# Patient Record
Sex: Male | Born: 1940 | Race: White | Hispanic: No | Smoking: Never smoker
Health system: Southern US, Community
[De-identification: ages and names within clinical notes are randomized; demographics above are authoritative.]

## PROBLEM LIST (undated history)

## (undated) DIAGNOSIS — F32A Depression, unspecified: Secondary | ICD-10-CM

## (undated) DIAGNOSIS — I10 Essential (primary) hypertension: Secondary | ICD-10-CM

## (undated) DIAGNOSIS — I739 Peripheral vascular disease, unspecified: Secondary | ICD-10-CM

## (undated) DIAGNOSIS — M109 Gout, unspecified: Secondary | ICD-10-CM

## (undated) DIAGNOSIS — R627 Adult failure to thrive: Secondary | ICD-10-CM

## (undated) DIAGNOSIS — F329 Major depressive disorder, single episode, unspecified: Secondary | ICD-10-CM

## (undated) DIAGNOSIS — N189 Chronic kidney disease, unspecified: Secondary | ICD-10-CM

---
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
## 2004-03-17 ENCOUNTER — Ambulatory Visit: Payer: Self-pay | Admitting: General Surgery

## 2004-09-28 ENCOUNTER — Ambulatory Visit: Payer: Self-pay | Admitting: Gastroenterology

## 2004-10-23 ENCOUNTER — Ambulatory Visit: Payer: Self-pay | Admitting: Unknown Physician Specialty

## 2004-11-02 ENCOUNTER — Ambulatory Visit: Payer: Self-pay | Admitting: Unknown Physician Specialty

## 2005-03-20 ENCOUNTER — Ambulatory Visit: Payer: Self-pay | Admitting: Urology

## 2005-06-05 ENCOUNTER — Ambulatory Visit: Payer: Self-pay | Admitting: *Deleted

## 2005-12-17 ENCOUNTER — Ambulatory Visit: Payer: Self-pay | Admitting: Internal Medicine

## 2007-07-22 ENCOUNTER — Ambulatory Visit: Payer: Self-pay | Admitting: Internal Medicine

## 2008-04-13 ENCOUNTER — Ambulatory Visit: Payer: Self-pay | Admitting: Unknown Physician Specialty

## 2011-12-24 LAB — CBC
HGB: 9.3 g/dL — ABNORMAL LOW (ref 13.0–18.0)
MCH: 28.7 pg (ref 26.0–34.0)
MCHC: 31.9 g/dL — ABNORMAL LOW (ref 32.0–36.0)
Platelet: 674 10*3/uL — ABNORMAL HIGH (ref 150–440)

## 2011-12-24 LAB — CK TOTAL AND CKMB (NOT AT ARMC)
CK, Total: 150 U/L (ref 35–232)
CK-MB: 0.8 ng/mL (ref 0.5–3.6)

## 2011-12-24 LAB — COMPREHENSIVE METABOLIC PANEL
Bilirubin,Total: 2.8 mg/dL — ABNORMAL HIGH (ref 0.2–1.0)
Chloride: 108 mmol/L — ABNORMAL HIGH (ref 98–107)
Co2: 11 mmol/L — ABNORMAL LOW (ref 21–32)
Creatinine: 2.27 mg/dL — ABNORMAL HIGH (ref 0.60–1.30)
EGFR (African American): 32 — ABNORMAL LOW
EGFR (Non-African Amer.): 28 — ABNORMAL LOW
Osmolality: 296 (ref 275–301)
Potassium: 5 mmol/L (ref 3.5–5.1)
SGPT (ALT): 10 U/L — ABNORMAL LOW (ref 12–78)
Sodium: 136 mmol/L (ref 136–145)
Total Protein: 7.9 g/dL (ref 6.4–8.2)

## 2011-12-25 ENCOUNTER — Inpatient Hospital Stay: Payer: Self-pay | Admitting: Family Medicine

## 2011-12-25 LAB — URINALYSIS, COMPLETE
Ketone: NEGATIVE
Nitrite: NEGATIVE
Ph: 5 (ref 4.5–8.0)
Protein: NEGATIVE
RBC,UR: 10 /HPF (ref 0–5)
Specific Gravity: 1.019 (ref 1.003–1.030)
WBC UR: 12 /HPF (ref 0–5)

## 2011-12-25 LAB — BASIC METABOLIC PANEL
Anion Gap: 10 (ref 7–16)
BUN: 68 mg/dL — ABNORMAL HIGH (ref 7–18)
Calcium, Total: 8.4 mg/dL — ABNORMAL LOW (ref 8.5–10.1)
Chloride: 116 mmol/L — ABNORMAL HIGH (ref 98–107)
Creatinine: 1.9 mg/dL — ABNORMAL HIGH (ref 0.60–1.30)
EGFR (African American): 40 — ABNORMAL LOW
Potassium: 4.4 mmol/L (ref 3.5–5.1)
Sodium: 141 mmol/L (ref 136–145)

## 2011-12-25 LAB — HEPATIC FUNCTION PANEL A (ARMC)
Albumin: 2.5 g/dL — ABNORMAL LOW (ref 3.4–5.0)
Alkaline Phosphatase: 108 U/L (ref 50–136)
Bilirubin, Direct: 1.6 mg/dL — ABNORMAL HIGH (ref 0.00–0.20)
SGOT(AST): 17 U/L (ref 15–37)
SGPT (ALT): 8 U/L — ABNORMAL LOW (ref 12–78)
Total Protein: 6.4 g/dL (ref 6.4–8.2)

## 2011-12-25 LAB — MAGNESIUM: Magnesium: 2.6 mg/dL — ABNORMAL HIGH

## 2011-12-25 LAB — PHOSPHORUS: Phosphorus: 4 mg/dL (ref 2.5–4.9)

## 2011-12-26 LAB — COMPREHENSIVE METABOLIC PANEL
Albumin: 2.4 g/dL — ABNORMAL LOW (ref 3.4–5.0)
Anion Gap: 7 (ref 7–16)
Bilirubin,Total: 1.4 mg/dL — ABNORMAL HIGH (ref 0.2–1.0)
Calcium, Total: 8 mg/dL — ABNORMAL LOW (ref 8.5–10.1)
Chloride: 121 mmol/L — ABNORMAL HIGH (ref 98–107)
Co2: 17 mmol/L — ABNORMAL LOW (ref 21–32)
Creatinine: 1.58 mg/dL — ABNORMAL HIGH (ref 0.60–1.30)
EGFR (African American): 50 — ABNORMAL LOW
EGFR (Non-African Amer.): 43 — ABNORMAL LOW
Osmolality: 301 (ref 275–301)
Potassium: 4.1 mmol/L (ref 3.5–5.1)
Sodium: 145 mmol/L (ref 136–145)

## 2011-12-26 LAB — CBC WITH DIFFERENTIAL/PLATELET
Basophil #: 0 10*3/uL (ref 0.0–0.1)
Basophil %: 0.2 %
Eosinophil #: 0.1 10*3/uL (ref 0.0–0.7)
Eosinophil %: 1.1 %
HCT: 22 % — ABNORMAL LOW (ref 40.0–52.0)
HGB: 7.1 g/dL — ABNORMAL LOW (ref 13.0–18.0)
Lymphocyte #: 0.6 10*3/uL — ABNORMAL LOW (ref 1.0–3.6)
Lymphocyte %: 10 %
MCH: 28.7 pg (ref 26.0–34.0)
MCHC: 32.2 g/dL (ref 32.0–36.0)
Monocyte %: 7.8 %
Neutrophil #: 4.5 10*3/uL (ref 1.4–6.5)
RBC: 2.47 10*6/uL — ABNORMAL LOW (ref 4.40–5.90)

## 2011-12-26 LAB — MAGNESIUM: Magnesium: 2.5 mg/dL — ABNORMAL HIGH

## 2011-12-27 LAB — CBC WITH DIFFERENTIAL/PLATELET
Basophil %: 0.5 %
Eosinophil #: 0.1 10*3/uL (ref 0.0–0.7)
HCT: 19.8 % — ABNORMAL LOW (ref 40.0–52.0)
HGB: 6.5 g/dL — ABNORMAL LOW (ref 13.0–18.0)
Lymphocyte #: 0.4 10*3/uL — ABNORMAL LOW (ref 1.0–3.6)
Lymphocyte %: 7.2 %
MCH: 29.4 pg (ref 26.0–34.0)
MCHC: 32.8 g/dL (ref 32.0–36.0)
MCV: 90 fL (ref 80–100)
Monocyte #: 0.3 x10 3/mm (ref 0.2–1.0)
Neutrophil #: 4.2 10*3/uL (ref 1.4–6.5)
RBC: 2.2 10*6/uL — ABNORMAL LOW (ref 4.40–5.90)
RDW: 15.8 % — ABNORMAL HIGH (ref 11.5–14.5)

## 2011-12-27 LAB — BASIC METABOLIC PANEL
BUN: 33 mg/dL — ABNORMAL HIGH (ref 7–18)
Calcium, Total: 7.9 mg/dL — ABNORMAL LOW (ref 8.5–10.1)
Chloride: 123 mmol/L — ABNORMAL HIGH (ref 98–107)
Co2: 17 mmol/L — ABNORMAL LOW (ref 21–32)
Osmolality: 299 (ref 275–301)
Potassium: 4.2 mmol/L (ref 3.5–5.1)
Sodium: 147 mmol/L — ABNORMAL HIGH (ref 136–145)

## 2011-12-28 LAB — BASIC METABOLIC PANEL
Anion Gap: 9 (ref 7–16)
Calcium, Total: 8 mg/dL — ABNORMAL LOW (ref 8.5–10.1)
Chloride: 119 mmol/L — ABNORMAL HIGH (ref 98–107)
Co2: 16 mmol/L — ABNORMAL LOW (ref 21–32)
Creatinine: 1.6 mg/dL — ABNORMAL HIGH (ref 0.60–1.30)
Osmolality: 292 (ref 275–301)
Potassium: 4.3 mmol/L (ref 3.5–5.1)

## 2011-12-28 LAB — MAGNESIUM: Magnesium: 2.3 mg/dL

## 2011-12-28 LAB — HEMOGLOBIN: HGB: 7.9 g/dL — ABNORMAL LOW (ref 13.0–18.0)

## 2011-12-29 LAB — BASIC METABOLIC PANEL WITH GFR
Anion Gap: 9
BUN: 18 mg/dL
Calcium, Total: 7.6 mg/dL — ABNORMAL LOW
Chloride: 116 mmol/L — ABNORMAL HIGH
Co2: 17 mmol/L — ABNORMAL LOW
Creatinine: 1.32 mg/dL — ABNORMAL HIGH
EGFR (African American): >60
EGFR (Non-African Amer.): 53 — ABNORMAL LOW
Glucose: 98 mg/dL
Osmolality: 285
Potassium: 3.8 mmol/L
Sodium: 142 mmol/L

## 2011-12-30 LAB — CBC WITH DIFFERENTIAL/PLATELET
Basophil #: 0 10*3/uL (ref 0.0–0.1)
Lymphocyte %: 21.3 %
Monocyte #: 0.2 x10 3/mm (ref 0.2–1.0)
Monocyte %: 5.6 %
Neutrophil %: 68 %
Platelet: 268 10*3/uL (ref 150–440)
RDW: 15.4 % — ABNORMAL HIGH (ref 11.5–14.5)
WBC: 3.9 10*3/uL (ref 3.8–10.6)

## 2011-12-30 LAB — BASIC METABOLIC PANEL
Calcium, Total: 7.4 mg/dL — ABNORMAL LOW (ref 8.5–10.1)
Chloride: 112 mmol/L — ABNORMAL HIGH (ref 98–107)
Co2: 18 mmol/L — ABNORMAL LOW (ref 21–32)
Creatinine: 1.12 mg/dL (ref 0.60–1.30)
EGFR (African American): >60
EGFR (Non-African Amer.): >60
Osmolality: 276 (ref 275–301)
Sodium: 139 mmol/L (ref 136–145)

## 2014-05-11 NOTE — Consult Note (Signed)
PATIENT NAME:  Troy Booker, Troy Booker MR#:  [id number] DATE OF BIRTH:  [birth year]  DATE OF CONSULTATION:  12/26/2011  REFERRING PHYSICIAN:   CONSULTING PHYSICIAN:  Shakiara Lukic K. Tiwan Schnitker, MD  SUBJECTIVE: The patient is a 74 year old white male who was seen in his room. He is resting calmly and quiet, but he did not talk much and eye contact is not very good, although he turned his head. I discussed with the floor physician about changing to Remeron to help him rest better at night and also Zyprexa 2.5 mg at bedtime to help him rest and also help him with his appetite and his thoughts.   OBJECTIVE: Dressed in hospital scrubs, alert but appears rather scared, would not verbalize anything today, just nods his head and turns his head today. Still refusing to eat any food and is on IV fluids. He has to get Ensure and not eating at all and not able to rest. Further mental status not done.  IMPRESSION: Major depressive disorder.  RECOMMENDATIONS: Discontinue Lexapro and discontinue Elavil. Start the patient on Remeron 15 mg p.o. at bedtime along with Zyprexa 2.5 mg at bedtime. This will probably help him rest better and help him with his appetite.  ____________________________ Wallace Cullens. Franchot Mimes, MD skc:slb D: 12/26/2011 10:09:10 ET T: 12/26/2011 10:45:18 ET JOB#: [id number]  cc: Arlyn Leak K. Franchot Mimes, MD, <Dictator> Dewain Penning MD ELECTRONICALLY SIGNED 12/29/2011 14:46

## 2014-05-11 NOTE — Consult Note (Signed)
PATIENT NAME:  Troy Booker, Troy Booker MR#:  [id number] DATE OF BIRTH:  03-05-1940  DATE OF CONSULTATION:  12/25/2011  REFERRING PHYSICIAN:   CONSULTING PHYSICIAN:  Qunicy Higinbotham K. Damontre Millea, MD  REASON FOR CONSULTATION: The patient was asked to be seen in consultation for failure to thrive and depression with neglect.  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 The patient is widowed for a long time and has been living with his son.  According to information obtained from the staff, the patient was covered with feces when he was brought to the hospital. He had feces on his beard.  In addition, he has excoriated wound and shows "neglect". Social Services was involved and they came to see the patient.  Staff reports the patient refuses to eat and all he wants to drink is Coca-Cola. He has to be encouraged a lot to eat.    PAST PSYCHIATRIC HISTORY: The patient denies any history of inpatient hospitalization to psychiatry, denies any history of mental illness, denies any history of suicide attempts, never been followed by a psychiatrist.  ALCOHOL AND DRUGS: Denied.  MENTAL STATUS: The patient is alert and oriented to place, person, and time, aware of the situation that brought him to the hospital. He is a good historian and he stated that his wife died many years ago, cannot remember the details. He reports that he lives with his son who is good to him and the reason why he does not eat is because he does not feel hungry and does not want to eat and said "I am just not hungry." Denies feeling hopeless or helpless, denies feeling worthless or useless, denies any ideas or plans to hurt himself or others. Denies any psychotic symptoms. Denies auditory or visual hallucinations or feeling paranoid. Insight and judgment are guarded.   IMPRESSION: Major depressive disorder with appetite loss.  RECOMMENDATIONS: Encourage the patient to eat and probably Ensure. Increase  his Lexapro to 20 mg p.o. daily, which will probably help him better with his appetite and eating.     Consider adding Elavil 10 mg p.o. at bedtime which will probably help him with his eating. Social Services has been involved for appropriate placement, at the time of discharge. I will reevaluate the patient on 12/26/2011. ____________________________ Wallace Cullens. Franchot Mimes, MD skc:slb D: 12/25/2011 18:43:50 ET T: 12/26/2011 08:17:01 ET JOB#: [id number]  cc: Arlyn Leak K. Franchot Mimes, MD, <Dictator> Dewain Penning MD ELECTRONICALLY SIGNED 12/29/2011 14:45
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
## 2016-05-11 DIAGNOSIS — F325 Major depressive disorder, single episode, in full remission: Secondary | ICD-10-CM | POA: Diagnosis present

## 2016-05-11 DIAGNOSIS — R350 Frequency of micturition: Secondary | ICD-10-CM | POA: Diagnosis present

## 2016-05-11 DIAGNOSIS — F03918 Unspecified dementia, unspecified severity, with other behavioral disturbance: Secondary | ICD-10-CM

## 2016-05-11 DIAGNOSIS — N401 Enlarged prostate with lower urinary tract symptoms: Secondary | ICD-10-CM | POA: Diagnosis present

## 2019-06-03 ENCOUNTER — Other Ambulatory Visit: Payer: Self-pay

## 2019-06-03 ENCOUNTER — Emergency Department: Payer: Medicare Other

## 2019-06-03 ENCOUNTER — Encounter: Payer: Self-pay | Admitting: Emergency Medicine

## 2019-06-03 ENCOUNTER — Emergency Department
Admission: EM | Admit: 2019-06-03 | Discharge: 2019-06-03 | Disposition: A | Discharge status: Home / Self Care | Payer: Medicare Other | Attending: Student | Admitting: Student

## 2019-06-03 DIAGNOSIS — W19XXXA Unspecified fall, initial encounter: Secondary | ICD-10-CM

## 2019-06-03 DIAGNOSIS — Y9389 Activity, other specified: Secondary | ICD-10-CM | POA: Diagnosis not present

## 2019-06-03 DIAGNOSIS — Y999 Unspecified external cause status: Secondary | ICD-10-CM | POA: Diagnosis not present

## 2019-06-03 DIAGNOSIS — N189 Chronic kidney disease, unspecified: Secondary | ICD-10-CM | POA: Diagnosis not present

## 2019-06-03 DIAGNOSIS — I129 Hypertensive chronic kidney disease with stage 1 through stage 4 chronic kidney disease, or unspecified chronic kidney disease: Secondary | ICD-10-CM | POA: Insufficient documentation

## 2019-06-03 DIAGNOSIS — W06XXXA Fall from bed, initial encounter: Secondary | ICD-10-CM | POA: Diagnosis not present

## 2019-06-03 DIAGNOSIS — Y92122 Bedroom in nursing home as the place of occurrence of the external cause: Secondary | ICD-10-CM | POA: Insufficient documentation

## 2019-06-03 DIAGNOSIS — S0101XA Laceration without foreign body of scalp, initial encounter: Secondary | ICD-10-CM | POA: Insufficient documentation

## 2019-06-03 DIAGNOSIS — S0990XA Unspecified injury of head, initial encounter: Secondary | ICD-10-CM | POA: Diagnosis present

## 2019-06-03 HISTORY — DX: Gout, unspecified: M10.9

## 2019-06-03 HISTORY — DX: Chronic kidney disease, unspecified: N18.9

## 2019-06-03 HISTORY — DX: Adult failure to thrive: R62.7

## 2019-06-03 HISTORY — DX: Essential (primary) hypertension: I10

## 2019-06-03 HISTORY — DX: Peripheral vascular disease, unspecified: I73.9

## 2019-06-03 HISTORY — DX: Depression, unspecified: F32.A

## 2019-06-03 NOTE — Discharge Instructions (Addendum)
Thank you for letting us take care of you in the emergency department today.   You have 2 staples that need to be removed in 1 week. This can be done in your regular doctor's office, an urgent care, or you can return here for removal.   Please follow up with: Your primary care doctor to review your ER visit and follow up on your symptoms.   Please return to the ER for any new or worsening symptoms.

## 2019-06-03 NOTE — ED Notes (Signed)
[facility]  CALLED  FOR  TRANSFER  TO  [facility]

## 2019-06-03 NOTE — ED Provider Notes (Signed)
[facility] Emergency Department Provider Note  ____________________________________________   First MD Initiated Contact with Patient 06/03/19 [phone]     (approximate)  I have reviewed the triage vital signs and the nursing notes.  History  Chief Complaint Head Injury    HPI Troy Booker is a 79 y.o. male with a history of failure to thrive in adult, depression, CKD, PVD who presents to the emergency department for a fall with resultant head injury.  Patient states he was asleep when he accidentally rolled out of bed and must of hit his head.  He has an abrasion/skin tear to the right scalp that is hemostatic.  Reports mild aching pain to this area with examination, but otherwise pain is 0/10.  Denies any other injuries elsewhere, including denies any neck pain, chest pain, abdominal pain, hip pain, extremity pain, back pain.  He is not on any blood thinning medications.  He denies any headache or dizziness.   Past Medical Hx Past Medical History:  Diagnosis Date  . Chronic kidney disease   . Depression   . FTT (failure to thrive) in adult   . Gout   . Hypertension   . PVD (peripheral vascular disease) ([place])     Problem List There are no problems to display for this patient.   Past Surgical Hx Non-contributory  Medications Allopurinol Amlodipine Duloxetine Gabapentin Mirtazapine Tamsulosin   Allergies Patient has no known allergies.  Family Hx No family history on file.  Social Hx Lives at [facility] [place]  Review of Systems  Constitutional: Negative for fever. Negative for chills. Eyes: Negative for visual changes. ENT: Negative for sore throat. Cardiovascular: Negative for chest pain. Respiratory: Negative for shortness of breath. Gastrointestinal: Negative for nausea. Negative for vomiting.  Genitourinary: Negative for dysuria. Musculoskeletal: Negative for leg swelling. Skin: + scalp abrasion/skin tear Neurological:  Negative for headaches.   Physical Exam  Vital Signs: ED Triage Vitals  Enc Vitals Group     BP 06/03/19 0612 138/70     Pulse Rate 06/03/19 0612 (!) 110     Resp 06/03/19 0612 20     Temp 06/03/19 0612 97.8 F (36.6 C)     Temp Source 06/03/19 0612 Oral     SpO2 06/03/19 0612 96 %     Weight 06/03/19 0615 204 lb (92.5 kg)     Height --      Head Circumference --      Peak Flow --      Pain Score 06/03/19 0616 0     Pain Loc --      Pain Edu? --      Excl. in [place]? --     Constitutional: Alert and oriented. Well appearing. NAD.  Head: Normocephalic.  Abrasion to the right scalp, temporal area, with a small (~1.5 cm) associated triangular skin flap. Not a discrete laceration, but would benefit from being tacked down. Patient agreeable with staples.  Eyes: Conjunctivae clear. Sclera anicteric. Pupils equal and symmetric. Nose: No masses or lesions. No congestion or rhinorrhea. Mouth/Throat: Wearing mask.  Neck: No stridor. Trachea midline.  No midline CS tenderness. Cardiovascular: Normal rate, regular rhythm. Extremities well perfused. Chest: Stable, nontender, no crepitance or deformities. Respiratory: Normal respiratory effort.  Lungs CTAB. Gastrointestinal: Soft. Non-distended. Non-tender.  Pelvis: Stable to AP and lateral compression.  FROM bilateral hips. Genitourinary: Deferred. Musculoskeletal: No lower extremity edema. No deformities.  FROM and nontender through bilateral shoulders, elbows, wrists, hips, knees, ankles. Neurologic:  Normal speech and language. No gross focal or lateralizing neurologic deficits are appreciated.  Skin: Abrasion/skin tear as above. Psychiatric: Mood and affect are appropriate for situation.   Radiology  Personally reviewed available imaging myself.   CT head/CS - IMPRESSION:  1. No evidence of acute intracranial or cervical spine injury.  2. Right scalp wound without calvarial fracture.    Procedures  Procedure(s) performed  (including critical care):  Marland KitchenMarland KitchenLaceration Repair  Date/Time: 06/03/2019 9:19 AM Performed by: Lilia Pro., MD Authorized by: Lilia Pro., MD   Consent:    Consent obtained:  Verbal   Consent given by:  Patient   Alternatives discussed:  No treatment Anesthesia (see MAR for exact dosages):    Anesthesia method:  None (offered patient local lidocaine injection, but given he will likely only need 1-2 staples, he opted to go straight for staples and defer an additional needle injection) Laceration details:    Location:  Scalp   Scalp location:  R temporal   Wound length (cm): 1-2 cm triangular skin flap. Repair type:    Repair type:  Simple Exploration:    Hemostasis achieved with:  Direct pressure   Wound exploration: wound explored through full range of motion     Wound extent: no foreign bodies/material noted   Treatment:    Area cleansed with:  Soap and water   Amount of cleaning:  Standard Skin repair:    Repair method:  Staples   Number of staples:  2 Post-procedure details:    Dressing:  Open (no dressing)   Patient tolerance of procedure:  Tolerated well, no immediate complications     Initial Impression / Assessment and Plan / MDM / ED Course  79 y.o. male who presents to the ED for a fall with head injury & scalp abrasion/skin tear, as a result of rolling out of bed.  Not on anticoagulation.  Exam as above.  Not a true discrete laceration, but this triangle skin tear/flap would benefit from being tacked down.  Patient agreeable with staples.  2 staples placed with good results, patient tolerated well.  CT imaging negative for any acute traumatic injuries.  As such, feel patient is stable for discharge with outpatient follow-up.  Given return precautions.  _______________________________   As part of my medical decision making I have reviewed available labs, radiology tests, reviewed old records/performed chart review.     Final Clinical Impression(s) / ED  Diagnosis  Final diagnoses:  Fall, initial encounter  Laceration of scalp, initial encounter       Note:  This document was prepared using Dragon voice recognition software and may include unintentional dictation errors.   Lilia Pro., MD 06/03/19 [phone]

## 2019-06-03 NOTE — ED Triage Notes (Signed)
Pt to triage via w/c with no distress noted, mask in place; EMS brought pt in from [facility]; pt st rolled OOB hitting rt side of head on dresser; dried blood noted; denies LOC/HA/dizziness; no cervical tenderness with palpation

## 2020-01-04 ENCOUNTER — Encounter: Payer: Self-pay | Admitting: Internal Medicine

## 2020-01-04 ENCOUNTER — Emergency Department: Payer: Medicare Other

## 2020-01-04 ENCOUNTER — Inpatient Hospital Stay
Admission: EM | Admit: 2020-01-04 | Discharge: 2020-01-08 | DRG: 871 | Disposition: A | Discharge status: Hospice | Payer: Medicare Other | Source: Skilled Nursing Facility | Attending: Internal Medicine | Admitting: Internal Medicine

## 2020-01-04 ENCOUNTER — Other Ambulatory Visit: Payer: Self-pay

## 2020-01-04 DIAGNOSIS — Z20822 Contact with and (suspected) exposure to covid-19: Secondary | ICD-10-CM | POA: Diagnosis present

## 2020-01-04 DIAGNOSIS — N179 Acute kidney failure, unspecified: Secondary | ICD-10-CM | POA: Diagnosis present

## 2020-01-04 DIAGNOSIS — F325 Major depressive disorder, single episode, in full remission: Secondary | ICD-10-CM | POA: Diagnosis present

## 2020-01-04 DIAGNOSIS — R350 Frequency of micturition: Secondary | ICD-10-CM | POA: Diagnosis present

## 2020-01-04 DIAGNOSIS — F32A Depression, unspecified: Secondary | ICD-10-CM | POA: Diagnosis present

## 2020-01-04 DIAGNOSIS — C799 Secondary malignant neoplasm of unspecified site: Secondary | ICD-10-CM | POA: Diagnosis not present

## 2020-01-04 DIAGNOSIS — E860 Dehydration: Secondary | ICD-10-CM | POA: Diagnosis present

## 2020-01-04 DIAGNOSIS — N1831 Chronic kidney disease, stage 3a: Secondary | ICD-10-CM | POA: Diagnosis present

## 2020-01-04 DIAGNOSIS — J9601 Acute respiratory failure with hypoxia: Secondary | ICD-10-CM | POA: Diagnosis present

## 2020-01-04 DIAGNOSIS — R54 Age-related physical debility: Secondary | ICD-10-CM | POA: Diagnosis present

## 2020-01-04 DIAGNOSIS — J189 Pneumonia, unspecified organism: Secondary | ICD-10-CM

## 2020-01-04 DIAGNOSIS — F0390 Unspecified dementia without behavioral disturbance: Secondary | ICD-10-CM | POA: Diagnosis not present

## 2020-01-04 DIAGNOSIS — E039 Hypothyroidism, unspecified: Secondary | ICD-10-CM | POA: Diagnosis present

## 2020-01-04 DIAGNOSIS — F0281 Dementia in other diseases classified elsewhere with behavioral disturbance: Secondary | ICD-10-CM | POA: Diagnosis present

## 2020-01-04 DIAGNOSIS — Z66 Do not resuscitate: Secondary | ICD-10-CM | POA: Diagnosis present

## 2020-01-04 DIAGNOSIS — A419 Sepsis, unspecified organism: Principal | ICD-10-CM | POA: Diagnosis present

## 2020-01-04 DIAGNOSIS — I10 Essential (primary) hypertension: Secondary | ICD-10-CM | POA: Diagnosis present

## 2020-01-04 DIAGNOSIS — R918 Other nonspecific abnormal finding of lung field: Secondary | ICD-10-CM | POA: Diagnosis not present

## 2020-01-04 DIAGNOSIS — J181 Lobar pneumonia, unspecified organism: Secondary | ICD-10-CM

## 2020-01-04 DIAGNOSIS — N401 Enlarged prostate with lower urinary tract symptoms: Secondary | ICD-10-CM | POA: Diagnosis present

## 2020-01-04 DIAGNOSIS — G309 Alzheimer's disease, unspecified: Secondary | ICD-10-CM | POA: Diagnosis present

## 2020-01-04 DIAGNOSIS — F03918 Unspecified dementia, unspecified severity, with other behavioral disturbance: Secondary | ICD-10-CM

## 2020-01-04 DIAGNOSIS — I129 Hypertensive chronic kidney disease with stage 1 through stage 4 chronic kidney disease, or unspecified chronic kidney disease: Secondary | ICD-10-CM | POA: Diagnosis present

## 2020-01-04 DIAGNOSIS — W06XXXA Fall from bed, initial encounter: Secondary | ICD-10-CM | POA: Diagnosis present

## 2020-01-04 DIAGNOSIS — G629 Polyneuropathy, unspecified: Secondary | ICD-10-CM | POA: Diagnosis present

## 2020-01-04 DIAGNOSIS — Z6829 Body mass index (BMI) 29.0-29.9, adult: Secondary | ICD-10-CM

## 2020-01-04 DIAGNOSIS — C787 Secondary malignant neoplasm of liver and intrahepatic bile duct: Secondary | ICD-10-CM | POA: Diagnosis present

## 2020-01-04 DIAGNOSIS — E440 Moderate protein-calorie malnutrition: Secondary | ICD-10-CM | POA: Diagnosis present

## 2020-01-04 DIAGNOSIS — Z87891 Personal history of nicotine dependence: Secondary | ICD-10-CM

## 2020-01-04 DIAGNOSIS — Z7189 Other specified counseling: Secondary | ICD-10-CM | POA: Diagnosis not present

## 2020-01-04 DIAGNOSIS — C3431 Malignant neoplasm of lower lobe, right bronchus or lung: Secondary | ICD-10-CM | POA: Diagnosis present

## 2020-01-04 DIAGNOSIS — N189 Chronic kidney disease, unspecified: Secondary | ICD-10-CM | POA: Diagnosis not present

## 2020-01-04 DIAGNOSIS — R0902 Hypoxemia: Secondary | ICD-10-CM

## 2020-01-04 DIAGNOSIS — F0391 Unspecified dementia with behavioral disturbance: Secondary | ICD-10-CM | POA: Diagnosis not present

## 2020-01-04 DIAGNOSIS — R627 Adult failure to thrive: Secondary | ICD-10-CM | POA: Diagnosis not present

## 2020-01-04 DIAGNOSIS — Z515 Encounter for palliative care: Secondary | ICD-10-CM

## 2020-01-04 DIAGNOSIS — J440 Chronic obstructive pulmonary disease with acute lower respiratory infection: Secondary | ICD-10-CM | POA: Diagnosis present

## 2020-01-04 DIAGNOSIS — R6520 Severe sepsis without septic shock: Secondary | ICD-10-CM | POA: Diagnosis present

## 2020-01-04 DIAGNOSIS — I739 Peripheral vascular disease, unspecified: Secondary | ICD-10-CM | POA: Diagnosis present

## 2020-01-04 DIAGNOSIS — E119 Type 2 diabetes mellitus without complications: Secondary | ICD-10-CM

## 2020-01-04 DIAGNOSIS — M109 Gout, unspecified: Secondary | ICD-10-CM | POA: Diagnosis present

## 2020-01-04 LAB — APTT: aPTT: 38 seconds — ABNORMAL HIGH (ref 24–36)

## 2020-01-04 LAB — CBC WITH DIFFERENTIAL/PLATELET
Abs Immature Granulocytes: 0.06 10*3/uL (ref 0.00–0.07)
Basophils Absolute: 0.1 10*3/uL (ref 0.0–0.1)
Basophils Relative: 1 %
Eosinophils Absolute: 0.2 10*3/uL (ref 0.0–0.5)
Eosinophils Relative: 2 %
HCT: 45.2 % (ref 39.0–52.0)
Hemoglobin: 13.3 g/dL (ref 13.0–17.0)
Immature Granulocytes: 1 %
Lymphocytes Relative: 11 %
Lymphs Abs: 1.4 10*3/uL (ref 0.7–4.0)
MCH: 24.1 pg — ABNORMAL LOW (ref 26.0–34.0)
MCHC: 29.4 g/dL — ABNORMAL LOW (ref 30.0–36.0)
MCV: 81.9 fL (ref 80.0–100.0)
Monocytes Absolute: 0.7 10*3/uL (ref 0.1–1.0)
Monocytes Relative: 6 %
Neutro Abs: 10.1 10*3/uL — ABNORMAL HIGH (ref 1.7–7.7)
Neutrophils Relative %: 79 %
Platelets: 587 10*3/uL — ABNORMAL HIGH (ref 150–400)
RBC: 5.52 MIL/uL (ref 4.22–5.81)
RDW: 17.2 % — ABNORMAL HIGH (ref 11.5–15.5)
WBC: 12.5 10*3/uL — ABNORMAL HIGH (ref 4.0–10.5)
nRBC: 0 % (ref 0.0–0.2)

## 2020-01-04 LAB — HEMOGLOBIN A1C
Hgb A1c MFr Bld: 6.3 % — ABNORMAL HIGH (ref 4.8–5.6)
Mean Plasma Glucose: 134.11 mg/dL

## 2020-01-04 LAB — RESP PANEL BY RT-PCR (FLU A&B, COVID) ARPGX2
Influenza A by PCR: NEGATIVE
Influenza B by PCR: NEGATIVE
SARS Coronavirus 2 by RT PCR: NEGATIVE

## 2020-01-04 LAB — CBG MONITORING, ED: Glucose-Capillary: 110 mg/dL — ABNORMAL HIGH (ref 70–99)

## 2020-01-04 LAB — BASIC METABOLIC PANEL
Anion gap: 15 (ref 5–15)
Anion gap: 11 (ref 5–15)
BUN: 16 mg/dL (ref 8–23)
BUN: 15 mg/dL (ref 8–23)
CO2: 29 mmol/L (ref 22–32)
CO2: 26 mmol/L (ref 22–32)
Calcium: 11 mg/dL — ABNORMAL HIGH (ref 8.9–10.3)
Calcium: 9.9 mg/dL (ref 8.9–10.3)
Chloride: 95 mmol/L — ABNORMAL LOW (ref 98–111)
Chloride: 100 mmol/L (ref 98–111)
Creatinine, Ser: 1.44 mg/dL — ABNORMAL HIGH (ref 0.61–1.24)
Creatinine, Ser: 1.15 mg/dL (ref 0.61–1.24)
GFR, Estimated: 49 mL/min — ABNORMAL LOW (ref >60)
GFR, Estimated: >60 mL/min (ref >60)
Glucose, Bld: 158 mg/dL — ABNORMAL HIGH (ref 70–99)
Glucose, Bld: 166 mg/dL — ABNORMAL HIGH (ref 70–99)
Potassium: 3.7 mmol/L (ref 3.5–5.1)
Potassium: 3.4 mmol/L — ABNORMAL LOW (ref 3.5–5.1)
Sodium: 139 mmol/L (ref 135–145)
Sodium: 137 mmol/L (ref 135–145)

## 2020-01-04 LAB — GLUCOSE, CAPILLARY
Glucose-Capillary: 114 mg/dL — ABNORMAL HIGH (ref 70–99)
Glucose-Capillary: 150 mg/dL — ABNORMAL HIGH (ref 70–99)

## 2020-01-04 LAB — VITAMIN D 25 HYDROXY (VIT D DEFICIENCY, FRACTURES): Vit D, 25-Hydroxy: 54.99 ng/mL (ref 30–100)

## 2020-01-04 LAB — HIV ANTIBODY (ROUTINE TESTING W REFLEX): HIV Screen 4th Generation wRfx: NONREACTIVE

## 2020-01-04 LAB — LACTIC ACID, PLASMA
Lactic Acid, Venous: 3.2 mmol/L (ref 0.5–1.9)
Lactic Acid, Venous: 1.1 mmol/L (ref 0.5–1.9)

## 2020-01-04 LAB — PROTIME-INR
INR: 1.1 (ref 0.8–1.2)
Prothrombin Time: 13.4 seconds (ref 11.4–15.2)

## 2020-01-04 LAB — TSH: TSH: 0.71 u[IU]/mL (ref 0.350–4.500)

## 2020-01-04 LAB — MRSA PCR SCREENING: MRSA by PCR: NEGATIVE

## 2020-01-04 MED ORDER — INSULIN ASPART 100 UNIT/ML ~~LOC~~ SOLN: 0.0000 [IU] | Freq: Three times a day (TID) | SUBCUTANEOUS | Status: DC

## 2020-01-04 MED ORDER — TAMSULOSIN HCL 0.4 MG PO CAPS
0.4000 mg | ORAL_CAPSULE | Freq: Every day | ORAL | Status: DC
  Administered 2020-01-04 – 2020-01-07 (×4): 0.4 mg via ORAL
  Filled 2020-01-04 (×4): qty 1

## 2020-01-04 MED ORDER — SODIUM CHLORIDE 0.9 % IV BOLUS
1000.0000 mL | Freq: Once | INTRAVENOUS | Status: AC
  Administered 2020-01-04: 1000 mL via INTRAVENOUS

## 2020-01-04 MED ORDER — SODIUM CHLORIDE 0.9 % IV BOLUS (SEPSIS)
500.0000 mL | Freq: Once | INTRAVENOUS | Status: AC
  Administered 2020-01-04: 06:00:00 500 mL via INTRAVENOUS

## 2020-01-04 MED ORDER — SODIUM CHLORIDE 0.9 % IV SOLN: INTRAVENOUS | Status: DC

## 2020-01-04 MED ORDER — ALUM & MAG HYDROXIDE-SIMETH 200-200-20 MG/5ML PO SUSP: 30.0000 mL | Freq: Four times a day (QID) | ORAL | Status: DC | PRN

## 2020-01-04 MED ORDER — SODIUM CHLORIDE 0.9 % IV SOLN
2.0000 g | INTRAVENOUS | Status: AC
  Administered 2020-01-05 – 2020-01-08 (×4): 2 g via INTRAVENOUS
  Filled 2020-01-04: qty 2
  Filled 2020-01-04 (×3): qty 20

## 2020-01-04 MED ORDER — LACTATED RINGERS IV SOLN: INTRAVENOUS | Status: DC

## 2020-01-04 MED ORDER — ENOXAPARIN SODIUM 40 MG/0.4ML ~~LOC~~ SOLN
40.0000 mg | SUBCUTANEOUS | Status: DC
Start: 2020-01-04 — End: 2020-01-04

## 2020-01-04 MED ORDER — DULOXETINE HCL 30 MG PO CPEP
60.0000 mg | ORAL_CAPSULE | Freq: Every day | ORAL | Status: DC
  Administered 2020-01-04 – 2020-01-08 (×5): 60 mg via ORAL
  Filled 2020-01-04 (×5): qty 2

## 2020-01-04 MED ORDER — METHYLPREDNISOLONE SODIUM SUCC 40 MG IJ SOLR
40.0000 mg | Freq: Every day | INTRAMUSCULAR | Status: DC
  Administered 2020-01-04 – 2020-01-06 (×3): 40 mg via INTRAVENOUS
  Filled 2020-01-04 (×3): qty 1

## 2020-01-04 MED ORDER — ACETAMINOPHEN 325 MG PO TABS
650.0000 mg | ORAL_TABLET | ORAL | Status: DC | PRN
  Administered 2020-01-07 – 2020-01-08 (×2): 650 mg via ORAL
  Filled 2020-01-04 (×2): qty 2

## 2020-01-04 MED ORDER — SODIUM CHLORIDE 0.9 % IV SOLN
500.0000 mg | INTRAVENOUS | Status: DC
  Administered 2020-01-04 – 2020-01-06 (×3): 500 mg via INTRAVENOUS
  Filled 2020-01-04 (×2): qty 500

## 2020-01-04 MED ORDER — SODIUM CHLORIDE 0.9 % IV SOLN: 2.0000 g | INTRAVENOUS | Status: DC

## 2020-01-04 MED ORDER — SIMETHICONE 80 MG PO CHEW
80.0000 mg | CHEWABLE_TABLET | ORAL | Status: DC | PRN
  Filled 2020-01-04: qty 1

## 2020-01-04 MED ORDER — MIRTAZAPINE 15 MG PO TABS
7.5000 mg | ORAL_TABLET | Freq: Every day | ORAL | Status: DC
  Administered 2020-01-04 – 2020-01-07 (×4): 7.5 mg via ORAL
  Filled 2020-01-04 (×4): qty 1

## 2020-01-04 MED ORDER — SODIUM CHLORIDE 0.9 % IV SOLN
500.0000 mg | Freq: Once | INTRAVENOUS | Status: DC
  Filled 2020-01-04: qty 500

## 2020-01-04 MED ORDER — ALLOPURINOL 100 MG PO TABS
100.0000 mg | ORAL_TABLET | Freq: Every day | ORAL | Status: DC
  Administered 2020-01-04 – 2020-01-08 (×5): 100 mg via ORAL
  Filled 2020-01-04 (×6): qty 1

## 2020-01-04 MED ORDER — IPRATROPIUM-ALBUTEROL 0.5-2.5 (3) MG/3ML IN SOLN
3.0000 mL | Freq: Four times a day (QID) | RESPIRATORY_TRACT | Status: DC
  Administered 2020-01-04 – 2020-01-06 (×7): 3 mL via RESPIRATORY_TRACT
  Filled 2020-01-04 (×8): qty 3

## 2020-01-04 MED ORDER — SODIUM CHLORIDE 0.9 % IV SOLN
2.0000 g | Freq: Once | INTRAVENOUS | Status: AC
  Administered 2020-01-04: 2 g via INTRAVENOUS
  Filled 2020-01-04: qty 20

## 2020-01-04 MED ORDER — ENOXAPARIN SODIUM 60 MG/0.6ML ~~LOC~~ SOLN
47.5000 mg | SUBCUTANEOUS | Status: DC
  Administered 2020-01-04 – 2020-01-06 (×3): 47.5 mg via SUBCUTANEOUS
  Filled 2020-01-04 (×3): qty 0.6

## 2020-01-04 NOTE — ED Notes (Signed)
Pt family contact updated.

## 2020-01-04 NOTE — ED Notes (Signed)
Provider notified of pt now disoriented to time which pt was not previously, unaware know if this is because he is sleepy or if he is declining. Other neuro symptoms (grip strength, pupils) remain unchanged.

## 2020-01-04 NOTE — Progress Notes (Signed)
PT Cancellation Note  Patient Details Name: BRIGG CAPE MRN: [id number] DOB: 07/07/1940   Cancelled Treatment:    Reason Eval/Treat Not Completed: Fatigue/lethargy limiting ability to participate. Patient sleeping soundly, unable to rouse. Will re-attempt tomorrow.    Madisun Hargrove 01/04/2020, 3:01 PM

## 2020-01-04 NOTE — ED Notes (Signed)
Date and time results received: 01/04/20 0541 (use smartphrase ".now" to insert current time)  Test: Lactic Acid Critical Value: 3.2  Name of Provider Notified: Dr. Damita Dunnings (Provider notified via secure chat at her request)  Orders Received? Or Actions Taken?: Provider notified

## 2020-01-04 NOTE — Progress Notes (Signed)
CODE SEPSIS - PHARMACY COMMUNICATION  **Broad Spectrum Antibiotics should be administered within 1 hour of Sepsis diagnosis**  Time Code Sepsis Called/Page Received: 4315  Antibiotics Ordered: Azithromycin and Ceftriaxone  Time of 1st antibiotic administration: [place], PharmD, [facility] 01/04/2020 6:22 AM

## 2020-01-04 NOTE — Progress Notes (Signed)
Patient ID: Troy Booker, male   DOB: [birth year], 79 y.o.   MRN: [id number] [facility] PROGRESS NOTE  LINWOOD GULLIKSON WUJ:[id number] DOB: Oct 18, 1940 DOA: 01/04/2020 PCP: Patient, No Pcp Per  HPI/Subjective: Patient coming in with some cough and shortness of breath.  Patient also found to be hypoxic.  Patient was admitted with right lower lobe pneumonia.  Patient able to answer some yes or no questions without a problem.  Objective: Vitals:   01/04/20 1030 01/04/20 1108  BP:  (!) 130/54  Pulse: 89 90  Resp: (!) 31 16  Temp:  98.8 F (37.1 C)  SpO2: 94% 97%    Intake/Output Summary (Last 24 hours) at 01/04/2020 1158 Last data filed at 01/04/2020 0641 Gross per 24 hour  Intake 1199 ml  Output --  Net 1199 ml   Filed Weights   01/04/20 1108  Weight: 96.6 kg    ROS: Review of Systems  Respiratory: Positive for cough and shortness of breath.   Cardiovascular: Negative for chest pain.  Gastrointestinal: Negative for abdominal pain, nausea and vomiting.   Exam: Physical Exam HENT:     Head: Normocephalic.     Mouth/Throat:     Pharynx: No oropharyngeal exudate.  Eyes:     General: Lids are normal.     Conjunctiva/sclera: Conjunctivae normal.     Pupils: Pupils are equal, round, and reactive to light.  Cardiovascular:     Rate and Rhythm: Normal rate and regular rhythm.     Heart sounds: Normal heart sounds, S1 normal and S2 normal.  Pulmonary:     Breath sounds: Examination of the right-middle field reveals wheezing. Examination of the left-middle field reveals wheezing. Examination of the right-lower field reveals decreased breath sounds and rhonchi. Examination of the left-lower field reveals decreased breath sounds and rhonchi. Decreased breath sounds, wheezing and rhonchi present. No rales.  Abdominal:     Palpations: Abdomen is soft.     Tenderness: There is no abdominal tenderness.  Musculoskeletal:     Right lower leg: No swelling.     Left lower  leg: No swelling.  Skin:    General: Skin is warm.     Findings: No rash.  Neurological:     Mental Status: He is alert.     Comments: Answers a few yes or no questions.  Able to straight leg raise without a problem.       Data Reviewed: Basic Metabolic Panel: Recent Labs  Lab 01/04/20 0344  NA 139  K 3.7  CL 95*  CO2 29  GLUCOSE 158*  BUN 16  CREATININE 1.44*  CALCIUM 11.0*   CBC: Recent Labs  Lab 01/04/20 0344  WBC 12.5*  NEUTROABS 10.1*  HGB 13.3  HCT 45.2  MCV 81.9  PLT 587*    CBG: Recent Labs  Lab 01/04/20 0955  GLUCAP 110*    Recent Results (from the past 240 hour(s))  Resp Panel by RT-PCR (Flu A&B, Covid) Nasopharyngeal Swab     Status: None   Collection Time: 01/04/20  6:02 AM   Specimen: Nasopharyngeal Swab; Nasopharyngeal(NP) swabs in vial transport medium  Result Value Ref Range Status   SARS Coronavirus 2 by RT PCR NEGATIVE NEGATIVE Final    Comment: (NOTE) SARS-CoV-2 target nucleic acids are NOT DETECTED.  The SARS-CoV-2 RNA is generally detectable in upper respiratory specimens during the acute phase of infection. The lowest concentration of SARS-CoV-2 viral copies this assay can detect is 138 copies/mL. A negative result does  not preclude SARS-Cov-2 infection and should not be used as the sole basis for treatment or other patient management decisions. A negative result may occur with  improper specimen collection/handling, submission of specimen other than nasopharyngeal swab, presence of viral mutation(s) within the areas targeted by this assay, and inadequate number of viral copies(<138 copies/mL). A negative result must be combined with clinical observations, patient history, and epidemiological information. The expected result is Negative.  Fact Sheet for Patients:  EntrepreneurPulse.com.au  Fact Sheet for Healthcare Providers:  IncredibleEmployment.be  This test is no t yet approved or  cleared by the [place] FDA and  has been authorized for detection and/or diagnosis of SARS-CoV-2 by FDA under an Emergency Use Authorization (EUA). This EUA will remain  in effect (meaning this test can be used) for the duration of the COVID-19 declaration under Section 564(b)(1) of the Act, 21 U.S.C.section 360bbb-3(b)(1), unless the authorization is terminated  or revoked sooner.       Influenza A by PCR NEGATIVE NEGATIVE Final   Influenza B by PCR NEGATIVE NEGATIVE Final    Comment: (NOTE) The Xpert Xpress SARS-CoV-2/FLU/RSV plus assay is intended as an aid in the diagnosis of influenza from Nasopharyngeal swab specimens and should not be used as a sole basis for treatment. Nasal washings and aspirates are unacceptable for Xpert Xpress SARS-CoV-2/FLU/RSV testing.  Fact Sheet for Patients: EntrepreneurPulse.com.au  Fact Sheet for Healthcare Providers: IncredibleEmployment.be  This test is not yet approved or cleared by the [place] FDA and has been authorized for detection and/or diagnosis of SARS-CoV-2 by FDA under an Emergency Use Authorization (EUA). This EUA will remain in effect (meaning this test can be used) for the duration of the COVID-19 declaration under Section 564(b)(1) of the Act, 21 U.S.C. section 360bbb-3(b)(1), unless the authorization is terminated or revoked.  Performed at [facility], [address] [place], [place] [address]      Studies: DG Chest Portable 1 View  Result Date: 01/04/2020 CLINICAL DATA:  Weakness, dyspnea EXAM: PORTABLE CHEST 1 VIEW COMPARISON:  12/25/2011 FINDINGS: Right basilar consolidation has developed, new since prior examination,. Left lung is clear. No pneumothorax or pleural effusion. Cardiac size within normal limits. Pulmonary vascularity is normal. No acute bone abnormality. IMPRESSION: Right basilar consolidation, possibly reflecting changes of acute lobar pneumonia  in the appropriate clinical setting. In absence of an appropriate clinical history, contrast enhanced CT examination would be helpful to exclude an underlying pulmonary mass. Follow-up chest radiograph is recommended in 4-6 weeks, following conservative therapy, to document complete resolution. Electronically Signed   By: Fidela Salisbury MD   On: 01/04/2020 04:10    Scheduled Meds: . allopurinol  100 mg Oral Daily  . DULoxetine  60 mg Oral Daily  . enoxaparin (LOVENOX) injection  47.5 mg Subcutaneous Q24H  . insulin aspart  0-15 Units Subcutaneous TID WC  . ipratropium-albuterol  3 mL Nebulization Q6H  . methylPREDNISolone (SOLU-MEDROL) injection  40 mg Intravenous Daily  . mirtazapine  7.5 mg Oral QHS  . tamsulosin  0.4 mg Oral QHS   Continuous Infusions: . azithromycin Stopped (01/04/20 0728)  . [START ON 01/05/2020] cefTRIAXone (ROCEPHIN)  IV    . lactated ringers 75 mL/hr at 01/04/20 1110    Assessment/Plan:   1. Severe sepsis present on admission with acute respiratory failure.  Also with leukocytosis white blood cell count 12.5 and tachycardia with a heart rate of 117 and right lower lobe pneumonia.  Continue Rocephin and Zithromax.  Follow-up cultures.  I added steroids with the patient's wheezing. 2. Acute hypoxic respiratory failure pulse ox with EMS 87% on room air.  On 2 L he did also have a period of hypoxia of 86%. 3. Dementia without behavioral disturbance continue Remeron at night and Cymbalta. 4. Dehydration with poor urine output.  Continue IV fluid hydration.  Likely underlying chronic kidney disease stage IIIa. 5. Hypercalcemia likely secondary to dehydration.  IV fluid hydration recheck repeat calcium.  Send off serum protein electrophoresis, parathyroid hormone,  PTH RP, vitamin D and TSH. 6. BPH on low-dose Flomax 7. Neuropathy holding Neurontin right now 8. History of gout, on low-dose allopurinol 9. Essential hypertension blood pressure on the lower side continue  to hold antihypertensives for now. 10. We will get swallow eval.   Code Status:     Code Status Orders  (From admission, onward)         Start     Ordered   01/04/20 0432  Full code  Continuous        01/04/20 0433        Code Status History    This patient has a current code status but no historical code status.   Advance Care Planning Activity    Advance Directive Documentation   Flowsheet Row Most Recent Value  Type of Advance Directive Healthcare Power of Attorney  Pre-existing out of facility DNR order (yellow form or pink MOST form) --  "MOST" Form in Place? --     Family Communication: Tried to reach the patient's son on the phone.  The person who answered the phone mentioned that the patient's son is at work and did not have a phone number for me.  The person that answered the phone did know that the patient was in the hospital. Disposition Plan: Status is: Inpatient  Dispo: The patient is from: Assisted living              Anticipated d/c is to: Assisted living              Anticipated d/c date is: Likely will need 3 days of IV antibiotics              Patient currently being treated for severe sepsis with acute hypoxic respiratory failure pneumonia requiring IV antibiotics.  Antibiotics:  Rocephin  Zithromax  Time spent: 35 minutes  [place]

## 2020-01-04 NOTE — ED Notes (Signed)
NRB removed, pt maintaining sats on 3L [place].  When pt becomes restless, he slouches in bed and mumbles rapidly.  Sats drop as he is not breathing through his nose.  Pt quickly recovers with repositioning and comfort measures.

## 2020-01-04 NOTE — H&P (Signed)
History and Physical    SCORPIO FORTIN ZJQ:[id number] DOB: 25-Feb-1940 DOA: 01/04/2020  PCP: Patient, No Pcp Per   Patient coming from: ALF I have personally briefly reviewed patient's old medical records in [place]  Chief Complaint: Cough, hypoxia  HPI: DAELIN HASTE is a 79 y.o. male with medical history significant for depression, dementia, HTN, CKD 3 a, BPH, who was brought in by EMS who reported that patient ' rolled out of bed' when the nursing home staff went to check on him.  Patient had been coughing and he now complains of weakness and shortness of breath he denied chest pain.  Has had no fever.  EMS recorded O2 sat of 91% on room air on their arrival, lowering to 87 and route to the hospital ED Course: On arrival, he was awake and alert, afebrile, tachycardic at 117, BP 124/67 with O2 sat 95% on 2 L.  Blood work revealed [place]ocytosis of [address], and creatinine of 1.44 which is his baseline, elevated calcium of 11 but overall otherwise unremarkable.  Covid and flu test lactic acid not done EKG as reviewed by me : Sinus tachycardia at 117 Imaging: Chest x-ray Right basilar consolidation, possibly reflecting changes of acute lobar pneumonia in the appropriate clinical setting. In absence of an appropriate clinical history, contrast enhanced CT examination would be helpful to exclude an underlying pulmonary mass. Follow-up chest radiograph is recommended in 4-6 weeks, following conservative therapy, to document complete resolution  Patient received an IV fluid bolus, started on IV antibiotics.  Hospitalist consulted for admission.  Review of Systems: Unreliable due to lethargy altered mental status  Past Medical History:  Diagnosis Date  . Chronic kidney disease   . Depression   . FTT (failure to thrive) in adult   . Gout   . Hypertension   . PVD (peripheral vascular disease) ([place])     History reviewed. No pertinent surgical history.   reports that he has never  smoked. He has never used smokeless tobacco. He reports previous alcohol use. He reports that he does not use drugs.  No Known Allergies  History reviewed. No pertinent family history.    Prior to Admission medications   Not on File    Physical Exam: Vitals:   01/04/20 0341  BP: 124/67  Pulse: (!) 117  Resp: 20  Temp: 98.5 F (36.9 C)  TempSrc: Oral  SpO2: 95%     Vitals:   01/04/20 0341  BP: 124/67  Pulse: (!) 117  Resp: 20  Temp: 98.5 F (36.9 C)  TempSrc: Oral  SpO2: 95%      Constitutional:  Lethargic and ill looking, oriented x2. Not in any apparent distress HEENT:      Head: Normocephalic and atraumatic.         Eyes: PERLA, EOMI, Conjunctivae are normal. Sclera is non-icteric.       Mouth/Throat: Mucous membranes are moist.       Neck: Supple with no signs of meningismus. Cardiovascular:  Tachycardic. No murmurs, gallops, or rubs. 2+ symmetrical distal pulses are present . No JVD. No LE edema Respiratory: Respiratory effort increased, tachypneic.Lungs sounds diminished on right and coarse.   Gastrointestinal: Soft, non tender, and non distended with positive bowel sounds.  Genitourinary: No CVA tenderness. Musculoskeletal: Nontender with normal range of motion in all extremities. No cyanosis, or erythema of extremities. Neurologic:  Face is symmetric. Moving all extremities. No gross focal neurologic deficits . Skin: Skin is warm, dry.  No  rash or ulcers Psychiatric: Mood and affect are normal    Labs on Admission: I have personally reviewed following labs and imaging studies  CBC: Recent Labs  Lab 01/04/20 0344  WBC 12.5*  NEUTROABS 10.1*  HGB 13.3  HCT 45.2  MCV 81.9  PLT 932*   Basic Metabolic Panel: Recent Labs  Lab 01/04/20 0344  NA 139  K 3.7  CL 95*  CO2 29  GLUCOSE 158*  BUN 16  CREATININE 1.44*  CALCIUM 11.0*   GFR: CrCl cannot be calculated (Unknown ideal weight.). Liver Function Tests: No results for input(s): AST,  ALT, ALKPHOS, BILITOT, PROT, ALBUMIN in the last 168 hours. No results for input(s): LIPASE, AMYLASE in the last 168 hours. No results for input(s): AMMONIA in the last 168 hours. Coagulation Profile: No results for input(s): INR, PROTIME in the last 168 hours. Cardiac Enzymes: No results for input(s): CKTOTAL, CKMB, CKMBINDEX, TROPONINI in the last 168 hours. BNP (last 3 results) No results for input(s): PROBNP in the last 8760 hours. HbA1C: No results for input(s): HGBA1C in the last 72 hours. CBG: No results for input(s): GLUCAP in the last 168 hours. Lipid Profile: No results for input(s): CHOL, HDL, LDLCALC, TRIG, CHOLHDL, LDLDIRECT in the last 72 hours. Thyroid Function Tests: No results for input(s): TSH, T4TOTAL, FREET4, T3FREE, THYROIDAB in the last 72 hours. Anemia Panel: No results for input(s): VITAMINB12, FOLATE, FERRITIN, TIBC, IRON, RETICCTPCT in the last 72 hours. Urine analysis:    Component Value Date/Time   COLORURINE Amber 12/25/2011 0031   APPEARANCEUR Cloudy 12/25/2011 0031   LABSPEC 1.019 12/25/2011 0031   PHURINE 5.0 12/25/2011 0031   GLUCOSEU Negative 12/25/2011 0031   HGBUR 2+ 12/25/2011 0031   BILIRUBINUR Negative 12/25/2011 0031   KETONESUR Negative 12/25/2011 0031   PROTEINUR Negative 12/25/2011 0031   NITRITE Negative 12/25/2011 0031   LEUKOCYTESUR 2+ 12/25/2011 0031    Radiological Exams on Admission: DG Chest Portable 1 View  Result Date: 01/04/2020 CLINICAL DATA:  Weakness, dyspnea EXAM: PORTABLE CHEST 1 VIEW COMPARISON:  12/25/2011 FINDINGS: Right basilar consolidation has developed, new since prior examination,. Left lung is clear. No pneumothorax or pleural effusion. Cardiac size within normal limits. Pulmonary vascularity is normal. No acute bone abnormality. IMPRESSION: Right basilar consolidation, possibly reflecting changes of acute lobar pneumonia in the appropriate clinical setting. In absence of an appropriate clinical history,  contrast enhanced CT examination would be helpful to exclude an underlying pulmonary mass. Follow-up chest radiograph is recommended in 4-6 weeks, following conservative therapy, to document complete resolution. Electronically Signed   By: Fidela Salisbury MD   On: 01/04/2020 04:10     Assessment/Plan 79 year old male with history of depression, dementia, HTN, CKD 3 a, BPH, presenting with cough and shortness of breath and weakness.  Hypoxic to 87 on room air with EMS  Sepsis RLL pneumonia Acute hypoxic respiratory failure -Patient with cough and shortness of breath, lethargy, tachycardic, tachypneic with hypoxia with right lower lobe pneumonia on chest x-ray -Lactic acid ordered following admission resulting at 3.2 -Chest x-ray with right basilar consolidation with recommendation for CT to exclude underlying mass and follow-up chest x-ray in 4 to 6 weeks to document resolution -Covid and flu test pending,  -Rocephin and azithromycin -Supplemental oxygen, mucolytic's, DuoNebs as needed -IV sepsis fluids    Benign prostatic hyperplasia with urinary frequency -Continue home meds pending med rec    Dementia in Alzheimer's disease ([place])   Major depression in remission ([place]) -Chronic and stable  Essential hypertension -Continue home meds    Hypothyroidism -Continue home meds    Stage 3a chronic kidney disease ([facility]) -Creatinine at baseline at 1.44    Type 2 diabetes mellitus without complications ([facility]) -Sliding scale insulin coverage    DVT prophylaxis: Lovenox  Code Status: full code  Family Communication:  none  Disposition Plan: Back to previous home environment Consults called: none  Status:At the time of admission, it appears that the appropriate admission status for this patient is INPATIENT. This is judged to be reasonable and necessary in order to provide the required intensity of service to ensure the patient's safety given the presenting symptoms, physical exam  findings, and initial radiographic and laboratory data in the context of their  Comorbid conditions.   Patient requires inpatient status due to high intensity of service, high risk for further deterioration and high frequency of surveillance required.   I certify that at the point of admission it is my clinical judgment that the patient will require inpatient hospital care spanning beyond [place] MD [facility]     01/04/2020, 4:33 AM

## 2020-01-04 NOTE — ED Provider Notes (Addendum)
[facility] Emergency Department Provider Note  ____________________________________________  Time seen: Approximately 4:17 AM  I have reviewed the triage vital signs and the nursing notes.   HISTORY  Chief Complaint Weakness    Level 5 Caveat: Portions of the History and Physical including HPI and review of systems are unable to be completely obtained due to patient being a poor historian   HPI Troy Booker is a 79 y.o. male with a history of CKD, hypertension who is sent to the ED due to rolling out of bed and appearing weak and short of breath when staff checked on him.  Patient denies any pain currently.  Has been having a cough recently.  No fever, no vomiting or diarrhea.   EMS report that on room air patient's oxygen saturation was about 87%.   Past Medical History:  Diagnosis Date  . Chronic kidney disease   . Depression   . FTT (failure to thrive) in adult   . Gout   . Hypertension   . PVD (peripheral vascular disease) ([place])      There are no problems to display for this patient.       Prior to Admission medications   Not on File  [facility] accompanies patient from facility   Allergies Patient has no known allergies.   No family history on file.  Social History   No tobacco or alcohol use   Review of Systems Level 5 Caveat: Portions of the History and Physical including HPI and review of systems are unable to be completely obtained due to patient being a poor historian   Constitutional:   No known fever.  ENT:   No rhinorrhea. Cardiovascular:   No chest pain or syncope. Respiratory: Positive shortness of breath and cough. Gastrointestinal:   Negative for abdominal pain, vomiting and diarrhea.  Musculoskeletal:   Negative for focal pain or swelling ____________________________________________   PHYSICAL EXAM:  VITAL SIGNS: ED Triage Vitals [01/04/20 0341]  Enc Vitals Group     BP 124/67     Pulse Rate (!) 117      Resp 20     Temp 98.5 F (36.9 C)     Temp Source Oral     SpO2 95 %     Weight      Height      Head Circumference      Peak Flow      Pain Score 0     Pain Loc      Pain Edu?      Excl. in [place]?     Vital signs reviewed, nursing assessments reviewed.   Constitutional:   Alert and oriented. Non-toxic appearance. Eyes:   Conjunctivae are normal. EOMI. PERRL. ENT      Head:   Normocephalic and atraumatic.      Nose:   No congestion/rhinnorhea.       Mouth/Throat:   Dry mucous membranes, no pharyngeal erythema. No peritonsillar mass.       Neck:   No meningismus. Full ROM. Hematological/Lymphatic/Immunilogical:   No cervical lymphadenopathy. Cardiovascular:   Tachycardia heart rate 110. Symmetric bilateral radial and DP pulses.  No murmurs. Cap refill less than 2 seconds. Respiratory:   Normal respiratory effort without tachypnea/retractions.  Crackles in right base. Gastrointestinal:   Soft and nontender. Non distended. There is no CVA tenderness.  No rebound, rigidity, or guarding. Musculoskeletal:   Normal range of motion in all extremities. No joint effusions.  No lower extremity tenderness.  No edema. Neurologic:   Normal speech and language.  Motor grossly intact. No acute focal neurologic deficits are appreciated.  Skin:    Skin is warm, dry and intact. No rash noted.  No petechiae, purpura, or bullae.  ____________________________________________    LABS (pertinent positives/negatives) (all labs ordered are listed, but only abnormal results are displayed) Labs Reviewed  BASIC METABOLIC PANEL - Abnormal; Notable for the following components:      Result Value   Chloride 95 (*)    Glucose, Bld 158 (*)    Creatinine, Ser 1.44 (*)    Calcium 11.0 (*)    GFR, Estimated 49 (*)    All other components within normal limits  CBC WITH DIFFERENTIAL/PLATELET - Abnormal; Notable for the following components:   WBC 12.5 (*)    MCH 24.1 (*)    MCHC 29.4 (*)    RDW 17.2  (*)    Platelets 587 (*)    Neutro Abs 10.1 (*)    All other components within normal limits  RESP PANEL BY RT-PCR (FLU A&B, COVID) [id number]   ____________________________________________   EKG Interpreted by me Sinus tachycardia rate 117.  Normal axis and intervals.  Normal QRS ST segments and T waves.  No ischemic changes.   ____________________________________________    [facility]  DG Chest Portable 1 View  Result Date: 01/04/2020 CLINICAL DATA:  Weakness, dyspnea EXAM: PORTABLE CHEST 1 VIEW COMPARISON:  12/25/2011 FINDINGS: Right basilar consolidation has developed, new since prior examination,. Left lung is clear. No pneumothorax or pleural effusion. Cardiac size within normal limits. Pulmonary vascularity is normal. No acute bone abnormality. IMPRESSION: Right basilar consolidation, possibly reflecting changes of acute lobar pneumonia in the appropriate clinical setting. In absence of an appropriate clinical history, contrast enhanced CT examination would be helpful to exclude an underlying pulmonary mass. Follow-up chest radiograph is recommended in 4-6 weeks, following conservative therapy, to document complete resolution. Electronically Signed   By: Fidela Salisbury MD   On: 01/04/2020 04:10    ____________________________________________   PROCEDURES Procedures  ____________________________________________  DIFFERENTIAL DIAGNOSIS   Pneumonia, pleural effusion, pneumothorax, pulmonary edema, electrolyte abnormality, dehydration  CLINICAL IMPRESSION / ASSESSMENT AND PLAN / ED COURSE  Medications ordered in the ED: Medications  cefTRIAXone (ROCEPHIN) 2 g in sodium chloride 0.9 % 100 mL IVPB (has no administration in time range)  azithromycin (ZITHROMAX) 500 mg in sodium chloride 0.9 % 250 mL IVPB (has no administration in time range)  sodium chloride 0.9 % bolus 1,000 mL (has no administration in time range)    Pertinent labs & imaging results that were available  during my care of the patient were reviewed by me and considered in my medical decision making (see chart for details).   Troy Booker was evaluated in Emergency Department on 01/04/2020 for the symptoms described in the history of present illness. He was evaluated in the context of the global COVID-19 pandemic, which necessitated consideration that the patient might be at risk for infection with the SARS-CoV-2 virus that causes COVID-19. Institutional protocols and algorithms that pertain to the evaluation of patients at risk for COVID-19 are in a state of rapid change based on information released by regulatory bodies including the CDC and federal and state organizations. These policies and algorithms were followed during the patient's care in the ED.   Patient presents with weakness, reported shortness of breath and cough.  Found to have oxygen saturation in the upper 80s by EMS on room air, stabilized on  2 L nasal cannula.  Chest x-ray shows right basilar consolidation concerning for pneumonia.  He is not septic.  Labs consistent with CKD.  Will start ceftriaxone and azithromycin, plan to admit for further management.      ____________________________________________   FINAL CLINICAL IMPRESSION(S) / ED DIAGNOSES    Final diagnoses:  Community acquired pneumonia of right lower lobe of lung  Acute respiratory failure with hypoxia [facility])     ED Discharge Orders    None      Portions of this note were generated with dragon dictation software. Dictation errors may occur despite best attempts at proofreading.   Carrie Mew, MD 01/04/20 [place] Maxin    Carrie Mew, MD 01/04/20 [phone]

## 2020-01-04 NOTE — ED Notes (Signed)
Dr. Damita Dunnings talked with this RN about pts new disorientation to time. Provider stated that pt has a history of dementia and no other actions needed at this time.

## 2020-01-04 NOTE — ED Notes (Signed)
Pt's alarms ringing, sats 79% on 4L w/good pleth wave. Slouching in bed, repositioned, NRB applied - sats coming up to 97%

## 2020-01-04 NOTE — Progress Notes (Signed)
PHARMACIST - PHYSICIAN COMMUNICATION  CONCERNING:  Enoxaparin (Lovenox) for DVT Prophylaxis    RECOMMENDATION: Patient was prescribed enoxaprin 40mg  q24 hours for VTE prophylaxis.   Filed Weights   01/04/20 1108  Weight: 96.6 kg (212 lb 15.4 oz)    Body mass index is 34.37 kg/m.  Estimated Creatinine Clearance: 43.8 mL/min (A) (by C-G formula based on SCr of 1.44 mg/dL (H)).   Based on [place] patient is candidate for enoxaparin 0.5mg /kg TBW SQ every 24 hours based on BMI being >30.   DESCRIPTION: Pharmacy has adjusted enoxaparin dose per [facility] policy.  Patient is now receiving enoxaparin 47.5  mg every 24 hours    Sylva Overley, PharmD Clinical Pharmacist  01/04/2020 11:21 AM

## 2020-01-04 NOTE — Evaluation (Signed)
Clinical/Bedside Swallow Evaluation Patient Details  Name: Troy Booker MRN: [id number] Date of Birth: 08-09-1940  Today's Date: 01/04/2020 Time: SLP Start Time (ACUTE ONLY): 1145 SLP Stop Time (ACUTE ONLY): 1229 SLP Time Calculation (min) (ACUTE ONLY): 44.8 min  Past Medical History:  Past Medical History:  Diagnosis Date  . Chronic kidney disease   . Depression   . FTT (failure to thrive) in adult   . Gout   . Hypertension   . PVD (peripheral vascular disease) ([place])    Past Surgical History: History reviewed. No pertinent surgical history. HPI:  Per admitting H&P "  Troy Booker is a 79 y.o. male with medical history significant for depression, dementia, HTN, CKD 3 a, BPH, who was brought in by EMS who reported that patient ' rolled out of bed' when the nursing home staff went to check on him.  Patient had been coughing and he now complains of weakness and shortness of breath he denied chest pain.  Has had no fever.  EMS recorded O2 sat of 91% on room air on their arrival, lowering to 87 and route to the hospital"   Assessment / Plan / Recommendation Clinical Impression  Pt was pleasant and cooperative throughout bedside sallow eval but fatigued easily. No immediate overt s/s of aspiration with any tested consistency. Pt did present with a weak, wet cough several times while ST was present but was not directly in relation to taking Po's. Pt did need encouragement to take small, single sips. Pt tolerated applesauce well but needed extended time to masticate solid boluses secondary to fatigue and no dentition. Will alter diet to Dysphagia 1 with thin liquids for now. May upgrade to soft solids once Pt shows overall improvement and strength. In light of right lower lobe pnewumonia and overall deconditioning, will monitor closely with thin liquids for s/s of aspiration and alter diet as needed.. Prognosis fair. Son was present and supportive. SLP Visit Diagnosis: Dysphagia,  oropharyngeal phase (R13.12)    Aspiration Risk  Mild aspiration risk;Moderate aspiration risk    Diet Recommendation Dysphagia 1 (Puree)   Liquid Administration via: Straw;Cup Medication Administration: Whole meds with puree Supervision: Staff to assist with self feeding Compensations: Slow rate;Small sips/bites;Minimize environmental distractions;Other (Comment) (Allow periods of rest) Postural Changes: Seated upright at 90 degrees;Remain upright for at least 30 minutes after po intake    Other  Recommendations   N/A  Follow up Recommendations Skilled Nursing facility      Frequency and Duration min 2x/week  1 week       Prognosis Prognosis for Safe Diet Advancement: Guarded      Swallow Study   General Date of Onset: 01/04/20 HPI: Per admitting H&P "  Troy Booker is a 79 y.o. male with medical history significant for depression, dementia, HTN, CKD 3 a, BPH, who was brought in by EMS who reported that patient ' rolled out of bed' when the nursing home staff went to check on him.  Patient had been coughing and he now complains of weakness and shortness of breath he denied chest pain.  Has had no fever.  EMS recorded O2 sat of 91% on room air on their arrival, lowering to 87 and route to the hospital" Type of Study: Bedside Swallow Evaluation Diet Prior to this Study: Regular Temperature Spikes Noted: No Respiratory Status: Nasal cannula History of Recent Intubation: No Behavior/Cognition: Cooperative;Pleasant mood;Lethargic/Drowsy Oral Cavity Assessment: Dry Oral Cavity - Dentition: Edentulous Vision: Functional for self-feeding Self-Feeding Abilities:  Needs set up Patient Positioning: Upright in bed Baseline Vocal Quality: Wet;Breathy Volitional Cough: Weak;Congested    Oral/Motor/Sensory Function Overall Oral Motor/Sensory Function: Within functional limits   Ice Chips Ice chips: Within functional limits Presentation: Spoon   Thin Liquid Thin Liquid: Within  functional limits Presentation: Cup;Spoon;Straw    Nectar Thick Nectar Thick Liquid: Not tested   Honey Thick Honey Thick Liquid: Not tested   Puree Puree: Within functional limits Presentation: Spoon;Self Fed   Solid     Solid: Impaired Presentation: Self Fed Oral Phase Impairments: Impaired mastication Oral Phase Functional Implications: Prolonged oral transit;Impaired mastication      Lucila Maine 01/04/2020,12:30 PM

## 2020-01-04 NOTE — ED Triage Notes (Signed)
Pt BIBA from the [facility]. Ems reports that pt "rolled out of bed" complaining of weakness and dyspnea. Ems reports the pt has hx of chronic cough and head rales in lower lobes. Pt vitals pta 91% on ra, 123/68, temp 97.1. Ems states oxygen lowered to 80s while en route.

## 2020-01-04 NOTE — Progress Notes (Signed)
Code Sepsis initiated @ 9798 AM, [place] following.

## 2020-01-05 ENCOUNTER — Encounter: Payer: Self-pay | Admitting: Internal Medicine

## 2020-01-05 ENCOUNTER — Inpatient Hospital Stay: Payer: Medicare Other

## 2020-01-05 DIAGNOSIS — F0391 Unspecified dementia with behavioral disturbance: Secondary | ICD-10-CM

## 2020-01-05 DIAGNOSIS — N189 Chronic kidney disease, unspecified: Secondary | ICD-10-CM

## 2020-01-05 DIAGNOSIS — N179 Acute kidney failure, unspecified: Secondary | ICD-10-CM

## 2020-01-05 LAB — BASIC METABOLIC PANEL
Anion gap: 11 (ref 5–15)
BUN: 18 mg/dL (ref 8–23)
CO2: 27 mmol/L (ref 22–32)
Calcium: 10.1 mg/dL (ref 8.9–10.3)
Chloride: 101 mmol/L (ref 98–111)
Creatinine, Ser: 1.12 mg/dL (ref 0.61–1.24)
GFR, Estimated: >60 mL/min (ref >60)
Glucose, Bld: 179 mg/dL — ABNORMAL HIGH (ref 70–99)
Potassium: 3.2 mmol/L — ABNORMAL LOW (ref 3.5–5.1)
Sodium: 139 mmol/L (ref 135–145)

## 2020-01-05 LAB — PROTEIN ELECTROPHORESIS, SERUM
A/G Ratio: 0.7 (ref 0.7–1.7)
Albumin ELP: 2.4 g/dL — ABNORMAL LOW (ref 2.9–4.4)
Alpha-1-Globulin: 0.4 g/dL (ref 0.0–0.4)
Alpha-2-Globulin: 1 g/dL (ref 0.4–1.0)
Beta Globulin: 0.9 g/dL (ref 0.7–1.3)
Gamma Globulin: 1 g/dL (ref 0.4–1.8)
Globulin, Total: 3.4 g/dL (ref 2.2–3.9)
Total Protein ELP: 5.8 g/dL — ABNORMAL LOW (ref 6.0–8.5)

## 2020-01-05 LAB — PARATHYROID HORMONE, INTACT (NO CA): PTH: 10 pg/mL — ABNORMAL LOW (ref 15–65)

## 2020-01-05 MED ORDER — ENSURE ENLIVE PO LIQD
237.0000 mL | Freq: Three times a day (TID) | ORAL | Status: DC
  Administered 2020-01-05 – 2020-01-06 (×3): 237 mL via ORAL

## 2020-01-05 MED ORDER — IOHEXOL 350 MG/ML SOLN
75.0000 mL | Freq: Once | INTRAVENOUS | Status: AC | PRN
  Administered 2020-01-05: 15:00:00 75 mL via INTRAVENOUS

## 2020-01-05 MED ORDER — POTASSIUM CHLORIDE CRYS ER 20 MEQ PO TBCR
40.0000 meq | EXTENDED_RELEASE_TABLET | Freq: Once | ORAL | Status: AC
  Administered 2020-01-05: 12:00:00 40 meq via ORAL
  Filled 2020-01-05: qty 2

## 2020-01-05 NOTE — Progress Notes (Signed)
Patient ID: Troy Booker, male   DOB: 23-Jun-1940, 79 y.o.   MRN: [id number]  Spoke with patient's son on the phone.  Best number to reach him is [phone].  Patient slowly improving but will need to walk and eat better prior to going home.  Treating for pneumonia.  We will get a CT scan of the chest to make sure that this is just a pneumonia.  Patient will need to walk with physical therapy.  Still trying to get him off the oxygen.  Dr Loletha Grayer

## 2020-01-05 NOTE — Progress Notes (Signed)
Patient ID: Troy Booker, male   DOB: 1940-03-20, 79 y.o.   MRN: [id number] [facility] PROGRESS NOTE  Troy Booker IRW:[id number] DOB: Feb 27, 1940 DOA: 01/04/2020 PCP: Patient, No Pcp Per  HPI/Subjective: Patient feeling a little bit better.  Still has a little cough and shortness of breath.  Breathing better.  Was receiving a nebulizer treatment this morning.  Objective: Vitals:   01/05/20 0830 01/05/20 1148  BP:  123/65  Pulse:  95  Resp:  16  Temp:  98.5 F (36.9 C)  SpO2: 96% (!) 87%    Intake/Output Summary (Last 24 hours) at 01/05/2020 1212 Last data filed at 01/05/2020 0600 Gross per 24 hour  Intake 1050 ml  Output 300 ml  Net 750 ml   Filed Weights   01/04/20 1108  Weight: 96.6 kg    ROS: Review of Systems  Respiratory: Positive for cough and shortness of breath.   Cardiovascular: Negative for chest pain.  Gastrointestinal: Negative for abdominal pain, nausea and vomiting.   Exam: Physical Exam HENT:     Head: Normocephalic.     Mouth/Throat:     Pharynx: No oropharyngeal exudate.  Eyes:     General: Lids are normal.     Conjunctiva/sclera: Conjunctivae normal.     Pupils: Pupils are equal, round, and reactive to light.  Cardiovascular:     Rate and Rhythm: Normal rate and regular rhythm.     Heart sounds: Normal heart sounds, S1 normal and S2 normal.  Pulmonary:     Breath sounds: Examination of the right-lower field reveals decreased breath sounds and rhonchi. Examination of the left-lower field reveals decreased breath sounds and rhonchi. Decreased breath sounds and rhonchi present. No wheezing.  Abdominal:     Palpations: Abdomen is soft.     Tenderness: There is no abdominal tenderness.  Musculoskeletal:     Right ankle: No swelling.     Left ankle: No swelling.  Skin:    General: Skin is warm.     Findings: No rash.  Neurological:     Mental Status: He is alert.     Comments: Answers some yes/no questions but does not  elaborate much.       Data Reviewed: Basic Metabolic Panel: Recent Labs  Lab 01/04/20 0344 01/04/20 1621 01/05/20 0832  NA 139 137 139  K 3.7 3.4* 3.2*  CL 95* 100 101  CO2 29 26 27   GLUCOSE 158* 166* 179*  BUN 16 15 18   CREATININE 1.44* 1.15 1.12  CALCIUM 11.0* 9.9 10.1   CBC: Recent Labs  Lab 01/04/20 0344  WBC 12.5*  NEUTROABS 10.1*  HGB 13.3  HCT 45.2  MCV 81.9  PLT 587*    CBG: Recent Labs  Lab 01/04/20 0955 01/04/20 1205 01/04/20 1551  GLUCAP 110* 114* 150*    Recent Results (from the past 240 hour(s))  Resp Panel by RT-PCR (Flu A&B, Covid) Nasopharyngeal Swab     Status: None   Collection Time: 01/04/20  6:02 AM   Specimen: Nasopharyngeal Swab; Nasopharyngeal(NP) swabs in vial transport medium  Result Value Ref Range Status   SARS Coronavirus 2 by RT PCR NEGATIVE NEGATIVE Final    Comment: (NOTE) SARS-CoV-2 target nucleic acids are NOT DETECTED.  The SARS-CoV-2 RNA is generally detectable in upper respiratory specimens during the acute phase of infection. The lowest concentration of SARS-CoV-2 viral copies this assay can detect is 138 copies/mL. A negative result does not preclude SARS-Cov-2 infection and should not be used as  the sole basis for treatment or other patient management decisions. A negative result may occur with  improper specimen collection/handling, submission of specimen other than nasopharyngeal swab, presence of viral mutation(s) within the areas targeted by this assay, and inadequate number of viral copies(<138 copies/mL). A negative result must be combined with clinical observations, patient history, and epidemiological information. The expected result is Negative.  Fact Sheet for Patients:  EntrepreneurPulse.com.au  Fact Sheet for Healthcare Providers:  IncredibleEmployment.be  This test is no t yet approved or cleared by the [place] FDA and  has been authorized for detection  and/or diagnosis of SARS-CoV-2 by FDA under an Emergency Use Authorization (EUA). This EUA will remain  in effect (meaning this test can be used) for the duration of the COVID-19 declaration under Section 564(b)(1) of the Act, 21 U.S.C.section 360bbb-3(b)(1), unless the authorization is terminated  or revoked sooner.       Influenza A by PCR NEGATIVE NEGATIVE Final   Influenza B by PCR NEGATIVE NEGATIVE Final    Comment: (NOTE) The Xpert Xpress SARS-CoV-2/FLU/RSV plus assay is intended as an aid in the diagnosis of influenza from Nasopharyngeal swab specimens and should not be used as a sole basis for treatment. Nasal washings and aspirates are unacceptable for Xpert Xpress SARS-CoV-2/FLU/RSV testing.  Fact Sheet for Patients: EntrepreneurPulse.com.au  Fact Sheet for Healthcare Providers: IncredibleEmployment.be  This test is not yet approved or cleared by the [place] FDA and has been authorized for detection and/or diagnosis of SARS-CoV-2 by FDA under an Emergency Use Authorization (EUA). This EUA will remain in effect (meaning this test can be used) for the duration of the COVID-19 declaration under Section 564(b)(1) of the Act, 21 U.S.C. section 360bbb-3(b)(1), unless the authorization is terminated or revoked.  Performed at [facility], [address] [place], [place] [address]   Culture, blood (x 2)     Status: None (Preliminary result)   Collection Time: 01/04/20  6:02 AM   Specimen: BLOOD  Result Value Ref Range Status   Specimen Description BLOOD LEFT [facility]  Final   Special Requests   Final    BOTTLES DRAWN AEROBIC AND ANAEROBIC Blood Culture adequate volume   Culture   Final    NO GROWTH 1 DAY Performed at [facility], [address], [place], [place] [address]    Report Status PENDING  Incomplete  Culture, blood (x 2)     Status: None (Preliminary result)   Collection Time: 01/04/20  6:02 AM    Specimen: BLOOD  Result Value Ref Range Status   Specimen Description BLOOD RIGHT WRIST  Final   Special Requests   Final    BOTTLES DRAWN AEROBIC AND ANAEROBIC Blood Culture results may not be optimal due to an inadequate volume of blood received in culture bottles   Culture   Final    NO GROWTH 1 DAY Performed at [facility], [address], [place], [place] [address]    Report Status PENDING  Incomplete  MRSA PCR Screening     Status: None   Collection Time: 01/04/20  1:30 PM   Specimen: Nasal Mucosa; Nasopharyngeal  Result Value Ref Range Status   MRSA by PCR NEGATIVE NEGATIVE Final    Comment:        The GeneXpert MRSA Assay (FDA approved for NASAL specimens only), is one component of a comprehensive MRSA colonization surveillance program. It is not intended to diagnose MRSA infection nor to guide or monitor treatment for MRSA  infections. Performed at [facility], [address] [place], [place] [address]      Studies: DG Chest Portable 1 View  Result Date: 01/04/2020 CLINICAL DATA:  Weakness, dyspnea EXAM: PORTABLE CHEST 1 VIEW COMPARISON:  12/25/2011 FINDINGS: Right basilar consolidation has developed, new since prior examination,. Left lung is clear. No pneumothorax or pleural effusion. Cardiac size within normal limits. Pulmonary vascularity is normal. No acute bone abnormality. IMPRESSION: Right basilar consolidation, possibly reflecting changes of acute lobar pneumonia in the appropriate clinical setting. In absence of an appropriate clinical history, contrast enhanced CT examination would be helpful to exclude an underlying pulmonary mass. Follow-up chest radiograph is recommended in 4-6 weeks, following conservative therapy, to document complete resolution. Electronically Signed   By: Fidela Salisbury MD   On: 01/04/2020 04:10    Scheduled Meds: . allopurinol  100 mg Oral Daily  . DULoxetine  60 mg Oral Daily  . enoxaparin (LOVENOX) injection   47.5 mg Subcutaneous Q24H  . feeding supplement  237 mL Oral TID BM  . ipratropium-albuterol  3 mL Nebulization Q6H  . methylPREDNISolone (SOLU-MEDROL) injection  40 mg Intravenous Daily  . mirtazapine  7.5 mg Oral QHS  . potassium chloride  40 mEq Oral Once  . tamsulosin  0.4 mg Oral QHS   Continuous Infusions: . azithromycin 500 mg (01/05/20 0450)  . cefTRIAXone (ROCEPHIN)  IV 2 g (01/05/20 0419)    Assessment/Plan:  1. Severe sepsis, present on admission with acute hypoxic respiratory failure.  Patient also had  leukocytosis with white blood cell count of 12.5 and tachycardia with a heart rate of 117 on presentation.  Patient has right lower lobe pneumonia. On Rocephin and Zithromax.  Steroids were added yesterday secondary to wheezing and lungs sound better today.  Patient was seen by speech pathology and placed on dysphagia diet. 2. Acute hypoxic respiratory failure with an EMS pulse ox of 87% on room air.  Yesterday on 2 L did have a period of hypoxia of 86%.  Today on room air had a pulse ox of 87%.  Try to taper off oxygen if able. 3. Hypercalcemia likely secondary to dehydration and poor diet.  With IV fluids his calcium came down.  Parathyroid hormone on low which usually goes against parathyroid etiology.  TSH normal range.  Vitamin D level normal.  Follow-up serum protein electrophoresis and PTH RP. 4. Acute kidney injury on chronic kidney disease stage II.  Creatinine 1.44 on presentation now down to 1.12.  We will stop IV fluids and see what happens. 5. Dementia without behavioral disturbance on Remeron at night and Cymbalta 6. BPH on low-dose Flomax 7. Neuropathy holding Neurontin for now 8. History of gout on low-dose allopurinol 9. Essential hypertension.  Blood pressure now normal continue to hold antihypertensives. 10. Weakness physical therapy evaluation 11. Palliative care evaluation     Code Status:     Code Status Orders  (From admission, onward)          Start     Ordered   01/04/20 0432  Full code  Continuous        01/04/20 0433        Code Status History    This patient has a current code status but no historical code status.   Advance Care Planning Activity    Advance Directive Documentation   Flowsheet Row Most Recent Value  Type of Advance Directive Healthcare Power of Attorney  Pre-existing out of facility DNR order (yellow form  or pink MOST form) --  "MOST" Form in Place? --     Family Communication: Tried to reach the patient's son Disposition Plan: Status is: Inpatient  Dispo: The patient is from: Assisted living              Anticipated d/c is to: Assisted living              Anticipated d/c date is: Possibly January 07, 2020              Patient currently being treated for severe sepsis and pneumonia and also watching calcium.  Antibiotics:  Rocephin  Zithromax  Time spent: 27 minutes  [place]

## 2020-01-05 NOTE — Plan of Care (Signed)
°  Problem: Nutrition: Goal: Adequate nutrition will be maintained Outcome: Progressing   Problem: Coping: Goal: Level of anxiety will decrease Outcome: Progressing   Problem: Elimination: Goal: Will not experience complications related to bowel motility Outcome: Progressing Goal: Will not experience complications related to urinary retention Outcome: Progressing   Problem: Skin Integrity: Goal: Risk for impaired skin integrity will decrease Outcome: Progressing

## 2020-01-05 NOTE — Progress Notes (Signed)
Speech Language Pathology Treatment: Dysphagia  Patient Details Name: Troy Booker MRN: [id number] DOB: 1940-05-31 Today's Date: 01/05/2020 Time: 0850-0920 SLP Time Calculation (min) (ACUTE ONLY): 30 min  Assessment / Plan / Recommendation Clinical Impression  Pt seen today for ongoing assessment of toleration of dysphagia diet. Pt is Edentulous and has Baseline Dementia. He appears to present w/ mild oropharyngeal phase dysphagia(including impulsive drinking behaviors) d/t impact from Cognitive status(baseline Dementia), as well as baseline Edentulous status both of which can increase risk for choking and aspiration. Pt's risk for aspiration is present, but reduced, when following general aspiration precautions. He requires Min verbal cues for follow through w/ general aspiration precautions; tray setup. Pt consumed trials of purees and thin liquids via Cup w/ no immediate, overt clinical s/s of aspiration noted; no decline in phonations, no cough, and no decline in respiratory status during/post trials. However, noted mild throat clearing post Multiple sips in a row of thin liquids AND post Belching. Oral phase was grossly [facility] for bolus management and oral clearing of the boluses given; no trials of increased texture attempted d/t pt declining po's w/ SLP after drinking his liquids and 3 bites of puree taken. SLP gave pt an Ensure supplement drink d/t declining foods and seeming to prefer drinking liquids; he consumed 3/4 of the drink. Noted intermittent Belching post po's -- any Esophageal dysmotility can increase risk for aspiration of REFLUX material Regurgitated during Belching. Discussed diet consistency of foods and trials of Minced foods when he desires. Discussed general aspiration precautions w/ pt including slowing down when drinking - smaller, Slower sips taking breaks intermittently pills Whole in puree; tray setup and monitoring. Recommend continue a Dysphagia level 1(puree) w/ thin  liquids; general aspiration precautions; reduce impulsive drinking behaviors to lessen risk for aspiration. Reflux precautions d/t increased Belching noted w/ intake. NSG/SW updated. NSG to reconsult ST services if new needs arise while admitted. ST services can follow pt at next venue of care for trials to upgrade diet when he feels more engaged to eat foods. Recommend Dietician consult for support -- pt may prefer drink supplements for ease of drinking.    HPI HPI: Per admitting H&P "  Troy Booker is a 79 y.o. male with medical history significant for depression, dementia, HTN, CKD 3 a, BPH, who was brought in by EMS who reported that patient ' rolled out of bed' when the nursing home staff went to check on him.  Patient had been coughing and he now complains of weakness and shortness of breath he denied chest pain.  Has had no fever.  EMS recorded O2 sat of 91% on room air on their arrival, lowering to 87 and route to the hospital"      SLP Plan  Continue with current plan of care       Recommendations  Diet recommendations: Dysphagia 1 (puree);Thin liquid Liquids provided via: Cup;No straw Medication Administration: Whole meds with puree (for safer swallowing) Supervision: Patient able to self feed (setup support) Compensations: Minimize environmental distractions;Slow rate;Small sips/bites;Lingual sweep for clearance of pocketing;Follow solids with liquid Postural Changes and/or Swallow Maneuvers: Seated upright 90 degrees;Upright 30-60 min after meal (Rest Breaks)                General recommendations:  (Dietician consult) Oral Care Recommendations: Oral care BID;Staff/trained caregiver to provide oral care Follow up Recommendations: Skilled Nursing facility SLP Visit Diagnosis: Dysphagia, oropharyngeal phase (R13.12) (edentulous baseline) Plan: Continue with current plan of care  GO                 Orinda Kenner, MS, CCC-SLP Speech Language  Pathologist Rehab Services [phone] [facility] 01/05/2020, 9:37 AM

## 2020-01-05 NOTE — Progress Notes (Signed)
PT Cancellation Note  Patient Details Name: Troy Booker MRN: [id number] DOB: May 01, 1940   Cancelled Treatment:    Reason Eval/Treat Not Completed: Patient declined, no reason specified. States "I was getting ready to go to sleep." I will re-attempt PT evaluation later today.    Narada Uzzle 01/05/2020, 11:18 AM

## 2020-01-05 NOTE — Evaluation (Signed)
Physical Therapy Evaluation Patient Details Name: Troy Booker MRN: [id number] DOB: 1940-08-02 Today's Date: 01/05/2020   History of Present Illness  Troy Booker is a 79 y.o. male with medical history significant for depression, dementia, HTN, CKD 3 a, BPH, who was brought in by EMS who reported that patient ' rolled out of bed' when the nursing home staff went to check on him.  Patient had been coughing and he now complains of weakness and shortness of breath he denied chest pain.  Has had no fever.  EMS recorded O2 sat of 91% on room air on their arrival, lowering to 87 and route to the hospital  Clinical Impression  Patient received in bed, somewhat irritated by my questions. Reports he is fine. Tells me he lives with his son and did not use any AD prior to arrival. Chart reveals patient came from facility after falling out of bed. Patient requires min assist for supine to sit, he is mod independent with sit to supine. Patient declines attempting to stand or walk at this time. Patient will need to have ambulation assessed prior to discharge. He will continue to benefit from skilled PT while here to improve strength and functional independence.         Follow Up Recommendations Home health PT    Equipment Recommendations  None recommended by PT;Other (comment) (TBD)    Recommendations for Other Services       Precautions / Restrictions Precautions Precautions: Fall Restrictions Weight Bearing Restrictions: No      Mobility  Bed Mobility Overal bed mobility: Needs Assistance Bed Mobility: Supine to Sit;Sit to Supine     Supine to sit: Min assist Sit to supine: Modified independent (Device/Increase time)   General bed mobility comments: patient requires min assist to raise trunk to full sitting position.    Transfers                 General transfer comment: refusing to attempt  Ambulation/Gait             General Gait Details: refusing to  attempt  Stairs            Wheelchair Mobility    Modified Rankin (Stroke Patients Only)       Balance Overall balance assessment: Needs assistance Sitting-balance support: Feet supported Sitting balance-Leahy Scale: Fair Sitting balance - Comments: patient able to sit unsupported at edge of bed.                                     Pertinent Vitals/Pain Pain Assessment: No/denies pain    Home Living Family/patient expects to be discharged to:: Skilled nursing facility                      Prior Function           Comments: unsure. Patient tells me he lives with his son and does not use AD at baseline. (i dont think that information is accurate.)     Hand Dominance        Extremity/Trunk Assessment   Upper Extremity Assessment Upper Extremity Assessment: Generalized weakness    Lower Extremity Assessment Lower Extremity Assessment: Generalized weakness    Cervical / Trunk Assessment Cervical / Trunk Assessment: Normal  Communication   Communication: No difficulties  Cognition Arousal/Alertness: Awake/alert Behavior During Therapy: Agitated Overall Cognitive Status: No family/caregiver present to determine  baseline cognitive functioning                                 General Comments: Patient's history does not match with his verbal history of prior level and home environment.      General Comments      Exercises     Assessment/Plan    PT Assessment Patient needs continued PT services  PT Problem List Decreased strength;Decreased mobility;Decreased activity tolerance;Decreased balance;Decreased cognition;Decreased safety awareness       PT Treatment Interventions Therapeutic activities;Therapeutic exercise;Gait training;Functional mobility training;Patient/family education    PT Goals (Current goals can be found in the Care Plan section)  Acute Rehab PT Goals Patient Stated Goal: none stated Time  For Goal Achievement: 01/19/20    Frequency Min 2X/week   Barriers to discharge        Co-evaluation               AM-PAC PT "6 Clicks" Mobility  Outcome Measure Help needed turning from your back to your side while in a flat bed without using bedrails?: None Help needed moving from lying on your back to sitting on the side of a flat bed without using bedrails?: A Little Help needed moving to and from a bed to a chair (including a wheelchair)?: A Little Help needed standing up from a chair using your arms (e.g., wheelchair or bedside chair)?: A Little Help needed to walk in hospital room?: A Little Help needed climbing 3-5 steps with a railing? : A Little 6 Click Score: 19    End of Session   Activity Tolerance: Patient limited by fatigue Patient left: in bed;with bed alarm set Nurse Communication: Mobility status PT Visit Diagnosis: Muscle weakness (generalized) (M62.81);Other abnormalities of gait and mobility (R26.89)    Time: 6759-1638 PT Time Calculation (min) (ACUTE ONLY): 12 min   Charges:   PT Evaluation $PT Eval Moderate Complexity: 1 Mod          Lynnet Hefley, PT, GCS 01/05/20,3:35 PM

## 2020-01-06 DIAGNOSIS — E440 Moderate protein-calorie malnutrition: Secondary | ICD-10-CM | POA: Insufficient documentation

## 2020-01-06 DIAGNOSIS — J189 Pneumonia, unspecified organism: Secondary | ICD-10-CM

## 2020-01-06 LAB — BASIC METABOLIC PANEL
Anion gap: 11 (ref 5–15)
BUN: 19 mg/dL (ref 8–23)
CO2: 29 mmol/L (ref 22–32)
Calcium: 10.3 mg/dL (ref 8.9–10.3)
Chloride: 100 mmol/L (ref 98–111)
Creatinine, Ser: 1.03 mg/dL (ref 0.61–1.24)
GFR, Estimated: >60 mL/min (ref >60)
Glucose, Bld: 150 mg/dL — ABNORMAL HIGH (ref 70–99)
Potassium: 3.8 mmol/L (ref 3.5–5.1)
Sodium: 140 mmol/L (ref 135–145)

## 2020-01-06 LAB — CBC
HCT: 32.8 % — ABNORMAL LOW (ref 39.0–52.0)
Hemoglobin: 10 g/dL — ABNORMAL LOW (ref 13.0–17.0)
MCH: 24.4 pg — ABNORMAL LOW (ref 26.0–34.0)
MCHC: 30.5 g/dL (ref 30.0–36.0)
MCV: 80 fL (ref 80.0–100.0)
Platelets: 399 10*3/uL (ref 150–400)
RBC: 4.1 MIL/uL — ABNORMAL LOW (ref 4.22–5.81)
RDW: 17.3 % — ABNORMAL HIGH (ref 11.5–15.5)
WBC: 13.6 10*3/uL — ABNORMAL HIGH (ref 4.0–10.5)
nRBC: 0 % (ref 0.0–0.2)

## 2020-01-06 MED ORDER — IPRATROPIUM-ALBUTEROL 0.5-2.5 (3) MG/3ML IN SOLN: 3.0000 mL | Freq: Four times a day (QID) | RESPIRATORY_TRACT | Status: DC | PRN

## 2020-01-06 MED ORDER — SODIUM CHLORIDE 0.9 % IV SOLN
INTRAVENOUS | Status: DC | PRN
  Administered 2020-01-06 (×2): 250 mL via INTRAVENOUS

## 2020-01-06 MED ORDER — AZITHROMYCIN 500 MG PO TABS
500.0000 mg | ORAL_TABLET | Freq: Every day | ORAL | Status: AC
  Administered 2020-01-07 – 2020-01-08 (×2): 500 mg via ORAL
  Filled 2020-01-06 (×2): qty 1

## 2020-01-06 MED ORDER — ENOXAPARIN SODIUM 40 MG/0.4ML ~~LOC~~ SOLN
40.0000 mg | SUBCUTANEOUS | Status: DC
  Administered 2020-01-07: 13:00:00 40 mg via SUBCUTANEOUS
  Filled 2020-01-06: qty 0.4

## 2020-01-06 MED ORDER — PREDNISONE 20 MG PO TABS
20.0000 mg | ORAL_TABLET | Freq: Every day | ORAL | Status: DC
  Administered 2020-01-07 – 2020-01-08 (×2): 20 mg via ORAL
  Filled 2020-01-06 (×2): qty 1

## 2020-01-06 MED ORDER — BOOST / RESOURCE BREEZE PO LIQD CUSTOM
1.0000 | Freq: Three times a day (TID) | ORAL | Status: DC
  Administered 2020-01-07 – 2020-01-08 (×3): 1 via ORAL

## 2020-01-06 NOTE — Progress Notes (Signed)
PHARMACIST - PHYSICIAN COMMUNICATION DR:   [place] CONCERNING: Antibiotic IV to Oral Route Change Policy  RECOMMENDATION: This patient is receiving azithromycin by the intravenous route.  Based on criteria approved by the Pharmacy and Therapeutics Committee, the antibiotic(s) is/are being converted to the equivalent oral dose form(s).   DESCRIPTION: These criteria include:  Patient being treated for a respiratory tract infection, urinary tract infection, cellulitis or clostridium difficile associated diarrhea if on metronidazole  The patient is not neutropenic and does not exhibit a GI malabsorption state  The patient is eating (either orally or via tube) and/or has been taking other orally administered medications for a least 24 hours  The patient is improving clinically and has a Tmax < 100.5  If you have questions about this conversion, please contact the [place], PharmD, BCPS Clinical Pharmacist 01/06/2020 12:22 PM

## 2020-01-06 NOTE — Progress Notes (Signed)
PHARMACIST - PHYSICIAN COMMUNICATION  CONCERNING:  Enoxaparin (Lovenox) for DVT Prophylaxis    RECOMMENDATION: Patient was prescribed enoxaprin 40mg  q24 hours for VTE prophylaxis.   Filed Weights   01/04/20 1108 01/06/20 0958  Weight: 96.6 kg (212 lb 15.4 oz) 82.1 kg (181 lb)    Body mass index is 29.21 kg/m.  Estimated Creatinine Clearance: 56.6 mL/min (by C-G formula based on SCr of 1.03 mg/dL).   Based on [place] patient is candidate for enoxaparin 0.5mg /kg TBW SQ every 24 hours based on BMI being >30.   DESCRIPTION: Pharmacy has adjusted enoxaparin dose per [facility] policy.  Patient is now receiving enoxaparin 47.5  mg every 24 hours    (Addendum:change to enoxaparin 40mg  q24 based on updated weight)  Lu Duffel, PharmD Clinical Pharmacist  01/06/2020 12:18 PM

## 2020-01-06 NOTE — Progress Notes (Signed)
Initial Nutrition Assessment  DOCUMENTATION CODES:   Non-severe (moderate) malnutrition in context of chronic illness  INTERVENTION:  Provide Boost Breeze po TID, each supplement provides 250 kcal and 9 grams of protein. Patient prefers orange.  Provide Magic cup TID with meals, each supplement provides 290 kcal and 9 grams of protein. Patient prefers chocolate.  Encouraged adequate intake of calories and protein at meals.   NUTRITION DIAGNOSIS:   Moderate Malnutrition related to chronic illness (dementia, CKD) as evidenced by moderate fat depletion,moderate muscle depletion,severe muscle depletion.  GOAL:   Patient will meet greater than or equal to 90% of their needs  MONITOR:   PO intake,Supplement acceptance,Labs,Weight trends,I & O's  REASON FOR ASSESSMENT:   Consult Assessment of nutrition requirement/status  ASSESSMENT:   79 year old male with PMHx of dementia, depression, HTN, PVD, gout, CKD, FTT admitted with sepsis, PNA, AKI on CKD stage II.   Met with patient at bedside. He reports since he moved to [facility] (reports he was previously at another facility) his appetite has been decreased and he has not been able to eat as well. He reports he does not like the food there. He reports his family will bring him food he likes at dinner meals. Following SLP evaluation on 12/13 diet was downgraded to dysphagia 1 with thin liquids. No meal documentation available in chart. Patient reports he was able to eat yesterday but is unable to describe how much. He was ordered for Ensure Enlive po TID yesterday but he reports he does not like them. After discussing other options available on formulary patient is amenable to trying orange Boost Breeze and chocolate Magic Cup.  Patient reports his UBW was around 200-215 lbs. He reports he has lost weight while at [facility] but is unsure how much weight he has lost. Patient documented to be 96.6 kg (212.96 lbs) but he does not appear to weigh  this much on assessment. RD obtained bed scale weight of 181 lbs (82.1 kg). Patient was 92.5 kg on 06/03/2019. He has lost 10.4 kg (11.2% body weight) over the past 7 months, which is not quite significant for time frame but is still concerning.  Medications reviewed and include: allopurinol, Solu-Medrol 40 mg daily IV, Remero 7.5 mg QHS, azithromycin, ceftriaxone.  Labs reviewed.  NUTRITION - FOCUSED PHYSICAL EXAM:  Flowsheet Row Most Recent Value  Orbital Region Moderate depletion  Upper Arm Region Moderate depletion  Thoracic and Lumbar Region Moderate depletion  Buccal Region Moderate depletion  Temple Region Severe depletion  Clavicle Bone Region Moderate depletion  Clavicle and Acromion Bone Region Moderate depletion  Scapular Bone Region Moderate depletion  Dorsal Hand Moderate depletion  Patellar Region Severe depletion  Anterior Thigh Region Severe depletion  Posterior Calf Region Severe depletion  Edema (RD Assessment) None  Hair Reviewed  Eyes Reviewed  Mouth Reviewed  Skin Reviewed  Nails Reviewed     Diet Order:   Diet Order            DIET - DYS 1 Room service appropriate? Yes; Fluid consistency: Thin  Diet effective now                EDUCATION NEEDS:   No education needs have been identified at this time  Skin:  Skin Assessment: Reviewed RN Assessment  Last BM:  01/05/2020 - medium type 4  Height:   Ht Readings from Last 1 Encounters:  01/04/20 _0  (1.676 m)   Weight:   Wt Readings  from Last 1 Encounters:  01/06/20 82.1 kg   BMI:  Body mass index is 29.21 kg/m.  Estimated Nutritional Needs:   Kcal:  1900-2100  Protein:  95-105 grams  Fluid:  1.9 L/day  Jacklynn Barnacle, MS, RD, LDN Pager number available on Amion

## 2020-01-06 NOTE — Progress Notes (Signed)
PROGRESS NOTE    Troy Booker  PYK:[id number] DOB: Jun 22, 1940 DOA: 01/04/2020 PCP: Patient, No Pcp Per   Chief Complaint  Patient presents with  . Weakness   Brief Narrative: 79 year old male with history of depression/dementia, hypertension, CKD stage III, BPH brought by EMS with report that patient "rolled out of bed" on nursing home staff went to check on him, has been coughing and complaining of weakness and shortness of breath, found to be hypoxic after 87% by EMS. Patient was brought to the ED, met sepsis criteria with tachycardia, leukocytosis, elevated creatinine, calcium.  Patient was admitted for further management.  X-ray showed right basilar consolidation possibly reflecting changes of acute lobar pneumonia.  Patient was placed on IV antibiotics.  Subjective: Afebrile overnight WBC of 13.6 from 12.5- on steroid for wheezing w/ PNA Renal function improved calcium normalized. No chest pain shortness of breath or fever On 2l [place] not on home o2  Assessment & Plan:  Severe sepsis with  RLL pneumonia/possiblt malignancy: on iv antibiotics: CT scan is status as below concerning for lower lobe mass/malignancy with osseous hepatic adrenal and pancreatic metastasis. CT Chest angio 12/14 " No CT evidence of pulmonary artery embolus.Large right infrahilar/right lower lobe mass/malignancy with mass effect and occlusion of the right middle and right lower lobe bronchi. He Nodal, osseous, hepatic, adrenal, and pancreatic metastatic disease. We will discuss with pulmonary/oncology discussed with Dr. Serafina Royals IR advised to the touch bbase w/ pulmonary, paged Dr. Raul Del.Patient on Rocephin/azithromycin. Spoke w/ son and updated him the results:  Acute hypoxic respiratory failure: not [place] o2 to maintain spo2 at least 90%.  Patient also on Solu-Medrol for wheezing.  Wean oxygen as tolerated.  Hypercalcemia likely for mets,? due to dehydration/poor diet calcium coming down with IV fluids PT  Xermelo, TSH normal vitamin D normal follow-up PTH RP/spep. Calcium improved to 10.3. monitor.  Acute kidney injury on CKD stage II-resolved from 1.4>1.0  BPH: On Flomax.  Dementia with behavioral disturbance -fall precautions, supportive care.  Essential hypertension: BP is controlled, home meds on hold.  Hypothyroidism: Continue home Synthroid  Major depression in remission mood is stable on Cymbalta.  Neuropathy-holding home Neurontin for now. History of gout no acute joint pain.  Continue low-dose allopurinol Debility/deconditioning PT OT on board  Goals of care palliative care consulted to discuss further.    Nutrition: Diet Order            DIET - DYS 1 Room service appropriate? Yes; Fluid consistency: Thin  Diet effective now                 Body mass index is 34.37 kg/m. DVT prophylaxis: lovenox Code Status:   Code Status: Full Code  Family Communication: plan of care discussed with patient at bedside.  Status is: Inpatient Remains inpatient appropriate because:IV treatments appropriate due to intensity of illness or inability to take PO and Inpatient level of care appropriate due to severity of illness  Dispo: The patient is from: ALF              Anticipated d/c is to: ALF              Anticipated d/c date is: 2-3 days              Patient currently is not medically stable to d/c.  Consultants:see note  Procedures:see note  Culture/Microbiology    Component Value Date/Time   SDES BLOOD LEFT [facility] 01/04/2020 0602   SDES BLOOD RIGHT  WRIST 01/04/2020 0602   SPECREQUEST  01/04/2020 0602    BOTTLES DRAWN AEROBIC AND ANAEROBIC Blood Culture adequate volume   SPECREQUEST  01/04/2020 0602    BOTTLES DRAWN AEROBIC AND ANAEROBIC Blood Culture results may not be optimal due to an inadequate volume of blood received in culture bottles   CULT  01/04/2020 0602    NO GROWTH 2 DAYS Performed at [facility], [address] [place], [place] [id number]     CULT  01/04/2020 0602    NO GROWTH 2 DAYS Performed at [facility], [address] [place], [place] [address]    REPTSTATUS PENDING 01/04/2020 0602   REPTSTATUS PENDING 01/04/2020 0602    Other culture-see note  Medications: Scheduled Meds: . allopurinol  100 mg Oral Daily  . DULoxetine  60 mg Oral Daily  . enoxaparin (LOVENOX) injection  47.5 mg Subcutaneous Q24H  . feeding supplement  237 mL Oral TID BM  . ipratropium-albuterol  3 mL Nebulization Q6H  . methylPREDNISolone (SOLU-MEDROL) injection  40 mg Intravenous Daily  . mirtazapine  7.5 mg Oral QHS  . tamsulosin  0.4 mg Oral QHS   Continuous Infusions: . sodium chloride Stopped (01/06/20 0650)  . azithromycin Stopped (01/06/20 5885)  . cefTRIAXone (ROCEPHIN)  IV Stopped (01/06/20 0547)    Antimicrobials: Anti-infectives (From admission, onward)   Start     Dose/Rate Route Frequency Ordered Stop   01/05/20 0430  cefTRIAXone (ROCEPHIN) 2 g in sodium chloride 0.9 % 100 mL IVPB        2 g 200 mL/hr over 30 Minutes Intravenous Every 24 hours 01/04/20 0620 01/09/20 0429   01/04/20 0445  cefTRIAXone (ROCEPHIN) 2 g in sodium chloride 0.9 % 100 mL IVPB  Status:  Discontinued        2 g 200 mL/hr over 30 Minutes Intravenous Every 24 hours 01/04/20 0433 01/04/20 0619   01/04/20 0445  azithromycin (ZITHROMAX) 500 mg in sodium chloride 0.9 % 250 mL IVPB        500 mg 250 mL/hr over 60 Minutes Intravenous Every 24 hours 01/04/20 0433 01/09/20 0444   01/04/20 0430  cefTRIAXone (ROCEPHIN) 2 g in sodium chloride 0.9 % 100 mL IVPB        2 g 200 mL/hr over 30 Minutes Intravenous  Once 01/04/20 0416 01/04/20 0557   01/04/20 0430  azithromycin (ZITHROMAX) 500 mg in sodium chloride 0.9 % 250 mL IVPB  Status:  Discontinued        500 mg 250 mL/hr over 60 Minutes Intravenous  Once 01/04/20 0416 01/04/20 0618     Objective: Vitals: Today's Vitals   01/05/20 2100 01/06/20 0009 01/06/20 0420 01/06/20 0757  BP:  (!) 142/65 (!)  148/64 137/62  Pulse:  81 81 88  Resp:  16 16 17   Temp:  97.7 F (36.5 C) 97.7 F (36.5 C) 97.9 F (36.6 C)  TempSrc:  Oral    SpO2:  96% 97% 91%  Weight:      Height:      PainSc: 0-No pain       Intake/Output Summary (Last 24 hours) at 01/06/2020 0923 Last data filed at 01/06/2020 0655 Gross per 24 hour  Intake 600.87 ml  Output 825 ml  Net -224.13 ml   Filed Weights   01/04/20 1108  Weight: 96.6 kg   Weight change:   Intake/Output from previous day: 12/14 0701 - 12/15 0700 In: 600.9 [P.O.:240; I.V.:10.9; IV Piggyback:350] Out: 825 [Urine:825] Intake/Output this shift:  No intake/output data recorded.  Examination: General exam: AAO to current president, palce, month year not to day, NAD, weak appearing. HEENT:Oral mucosa moist, Ear/Nose WNL grossly,dentition normal. Respiratory system: bilaterally diminished breath sounds,no wheezing or crackles,no use of accessory muscle, non tender. Cardiovascular system: S1 & S2 +, regular, No JVD. Gastrointestinal system: Abdomen soft, NT,ND, BS+. Nervous System:Alert, awake, moving extremities and grossly nonfocal Extremities: No edema, distal peripheral pulses palpable.  Skin: No rashes,no icterus. MSK: Normal muscle bulk,tone, power. Chronic skin changes on RLE,   Data Reviewed: I have personally reviewed following labs and imaging studies CBC: Recent Labs  Lab 01/04/20 0344 01/06/20 0413  WBC 12.5* 13.6*  NEUTROABS 10.1*  --   HGB 13.3 10.0*  HCT 45.2 32.8*  MCV 81.9 80.0  PLT 587* 037   Basic Metabolic Panel: Recent Labs  Lab 01/04/20 0344 01/04/20 1621 01/05/20 0832 01/06/20 0413  NA 139 137 139 140  K 3.7 3.4* 3.2* 3.8  CL 95* 100 101 100  CO2 29 26 27 29   GLUCOSE 158* 166* 179* 150*  BUN 16 15 18 19   CREATININE 1.44* 1.15 1.12 1.03  CALCIUM 11.0* 9.9 10.1 10.3   GFR: Estimated Creatinine Clearance: 61.2 mL/min (by C-G formula based on SCr of 1.03 mg/dL). Liver Function Tests: No results for  input(s): AST, ALT, ALKPHOS, BILITOT, PROT, ALBUMIN in the last 168 hours. No results for input(s): LIPASE, AMYLASE in the last 168 hours. No results for input(s): AMMONIA in the last 168 hours. Coagulation Profile: Recent Labs  Lab 01/04/20 0344  INR 1.1   Cardiac Enzymes: No results for input(s): CKTOTAL, CKMB, CKMBINDEX, TROPONINI in the last 168 hours. BNP (last 3 results) No results for input(s): PROBNP in the last 8760 hours. HbA1C: Recent Labs    01/04/20 0344  HGBA1C 6.3*   CBG: Recent Labs  Lab 01/04/20 0955 01/04/20 1205 01/04/20 1551  GLUCAP 110* 114* 150*   Lipid Profile: No results for input(s): CHOL, HDL, LDLCALC, TRIG, CHOLHDL, LDLDIRECT in the last 72 hours. Thyroid Function Tests: Recent Labs    01/04/20 1621  TSH 0.710   Anemia Panel: No results for input(s): VITAMINB12, FOLATE, FERRITIN, TIBC, IRON, RETICCTPCT in the last 72 hours. Sepsis Labs: Recent Labs  Lab 01/04/20 0344 01/04/20 0602  LATICACIDVEN 3.2* 1.1    Recent Results (from the past 240 hour(s))  Resp Panel by RT-PCR (Flu A&B, Covid) Nasopharyngeal Swab     Status: None   Collection Time: 01/04/20  6:02 AM   Specimen: Nasopharyngeal Swab; Nasopharyngeal(NP) swabs in vial transport medium  Result Value Ref Range Status   SARS Coronavirus 2 by RT PCR NEGATIVE NEGATIVE Final    Comment: (NOTE) SARS-CoV-2 target nucleic acids are NOT DETECTED.  The SARS-CoV-2 RNA is generally detectable in upper respiratory specimens during the acute phase of infection. The lowest concentration of SARS-CoV-2 viral copies this assay can detect is 138 copies/mL. A negative result does not preclude SARS-Cov-2 infection and should not be used as the sole basis for treatment or other patient management decisions. A negative result may occur with  improper specimen collection/handling, submission of specimen other than nasopharyngeal swab, presence of viral mutation(s) within the areas targeted by this  assay, and inadequate number of viral copies(<138 copies/mL). A negative result must be combined with clinical observations, patient history, and epidemiological information. The expected result is Negative.  Fact Sheet for Patients:  EntrepreneurPulse.com.au  Fact Sheet for Healthcare Providers:  IncredibleEmployment.be  This test is no t yet  approved or cleared by the [place] and  has been authorized for detection and/or diagnosis of SARS-CoV-2 by FDA under an Emergency Use Authorization (EUA). This EUA will remain  in effect (meaning this test can be used) for the duration of the COVID-19 declaration under Section 564(b)(1) of the Act, 21 U.S.C.section 360bbb-3(b)(1), unless the authorization is terminated  or revoked sooner.       Influenza A by PCR NEGATIVE NEGATIVE Final   Influenza B by PCR NEGATIVE NEGATIVE Final    Comment: (NOTE) The Xpert Xpress SARS-CoV-2/FLU/RSV plus assay is intended as an aid in the diagnosis of influenza from Nasopharyngeal swab specimens and should not be used as a sole basis for treatment. Nasal washings and aspirates are unacceptable for Xpert Xpress SARS-CoV-2/FLU/RSV testing.  Fact Sheet for Patients: EntrepreneurPulse.com.au  Fact Sheet for Healthcare Providers: IncredibleEmployment.be  This test is not yet approved or cleared by the [place] FDA and has been authorized for detection and/or diagnosis of SARS-CoV-2 by FDA under an Emergency Use Authorization (EUA). This EUA will remain in effect (meaning this test can be used) for the duration of the COVID-19 declaration under Section 564(b)(1) of the Act, 21 U.S.C. section 360bbb-3(b)(1), unless the authorization is terminated or revoked.  Performed at [facility], [address] [place], [place] [address]   Culture, blood (x 2)     Status: None (Preliminary result)   Collection Time:  01/04/20  6:02 AM   Specimen: BLOOD  Result Value Ref Range Status   Specimen Description BLOOD LEFT [facility]  Final   Special Requests   Final    BOTTLES DRAWN AEROBIC AND ANAEROBIC Blood Culture adequate volume   Culture   Final    NO GROWTH 2 DAYS Performed at [facility], [address], [place], [place] [address]    Report Status PENDING  Incomplete  Culture, blood (x 2)     Status: None (Preliminary result)   Collection Time: 01/04/20  6:02 AM   Specimen: BLOOD  Result Value Ref Range Status   Specimen Description BLOOD RIGHT WRIST  Final   Special Requests   Final    BOTTLES DRAWN AEROBIC AND ANAEROBIC Blood Culture results may not be optimal due to an inadequate volume of blood received in culture bottles   Culture   Final    NO GROWTH 2 DAYS Performed at [facility], [address] [place], [place] [address]    Report Status PENDING  Incomplete  MRSA PCR Screening     Status: None   Collection Time: 01/04/20  1:30 PM   Specimen: Nasal Mucosa; Nasopharyngeal  Result Value Ref Range Status   MRSA by PCR NEGATIVE NEGATIVE Final    Comment:        The GeneXpert MRSA Assay (FDA approved for NASAL specimens only), is one component of a comprehensive MRSA colonization surveillance program. It is not intended to diagnose MRSA infection nor to guide or monitor treatment for MRSA infections. Performed at [facility], [address] [place], [place] [address]      Radiology Studies: CT ANGIO CHEST PE W OR WO CONTRAST  Result Date: 01/05/2020 CLINICAL DATA:  79 year old male with concern for pulmonary embolism. EXAM: CT ANGIOGRAPHY CHEST WITH CONTRAST TECHNIQUE: Multidetector CT imaging of the chest was performed using the standard protocol during bolus administration of intravenous contrast. Multiplanar CT image reconstructions and MIPs were obtained to evaluate the vascular anatomy. CONTRAST:  6m OMNIPAQUE IOHEXOL 350 MG/ML  SOLN COMPARISON:   None. FINDINGS: Cardiovascular: Top-normal cardiac size. No pericardial effusion. There is coronary vascular calcification. There is moderate atherosclerotic calcification of the thoracic aorta. No aneurysmal dilatation or dissection. The origins of the great vessels of the aortic arch appear patent as visualized. No pulmonary artery embolus identified. Mediastinum/Nodes: Subcarinal adenopathy measures 17 mm in short axis. Right hilar adenopathy measures 16 mm in short axis. The esophagus is grossly unremarkable. No mediastinal fluid collection. Lungs/Pleura: There is a large area of masslike consolidation in the right lower lobe extending to the right hilum. The hypoattenuating masslike density in the right hilar/infrahilar region extending to the posterior and lateral basal segments and pleural surface measures approximately 11 x 8 cm in greatest axial dimensions (202/5) and 7 cm in craniocaudal length. There is associated mass effect and high-grade narrowing of the bronchus intermedius and complete occlusion of the right middle and right lower lobe bronchi. There is additionally mass effect and splaying of the right lower lobe pulmonary artery branches. There is a background of moderate centrilobular emphysema. Left lung base subsegmental atelectasis. There are small bilateral pleural effusions. There is slight thickening and nodularity of the posterior right pleural surface concerning for metastatic implants. No pneumothorax. Upper Abdomen: Bilateral adrenal masses measuring up to 4.5 x 3.0 cm on the right consistent with metastatic disease. There is a large area of hypoattenuation involving the left lobe of the liver most likely metastatic disease. Hypoperfusion/infarcts secondary to portal vein thrombosis is less likely but not excluded. Evaluation of the patency of the main portal vein is limited on this CT in the absence of delayed/portal phase imaging. Duplex ultrasound may provide better evaluation and  assess patency of the main portal vein. Hypoattenuating lesion in the caudate lobe also consistent with metastatic disease. There is ill-defined hypoattenuating lesion in the distal body/tail of the pancreas most consistent with metastasis. Musculoskeletal: Old right rib fractures. No acute fracture. There is a 3.5 x 2.5 cm metastatic lytic/destructive lesion involving the left second rib. Review of the MIP images confirms the above findings. IMPRESSION: 1. No CT evidence of pulmonary artery embolus. 2. Large right infrahilar/right lower lobe mass/malignancy with mass effect and occlusion of the right middle and right lower lobe bronchi. 3. Nodal, osseous, hepatic, adrenal, and pancreatic metastatic disease. 4. Aortic Atherosclerosis (ICD10-I70.0) and Emphysema (ICD10-J43.9). Electronically Signed   By: Anner Crete M.D.   On: 01/05/2020 16:04     LOS: 2 days   Antonieta Pert, MD [facility]  01/06/2020, 9:23 AM

## 2020-01-06 NOTE — Progress Notes (Signed)
Physical Therapy Treatment Patient Details Name: Troy Booker MRN: [id number] DOB: 01-Nov-1940 Today's Date: 01/06/2020    History of Present Illness Troy Booker is a 79 y.o. male with medical history significant for depression, dementia, HTN, CKD 3 a, BPH, who was brought in by EMS who reported that patient ' rolled out of bed' when the nursing home staff went to check on him.  Patient had been coughing and he now complains of weakness and shortness of breath he denied chest pain.  Has had no fever.  EMS recorded O2 sat of 91% on room air on their arrival, lowering to 87 and route to the hospital    PT Comments    Pt was supine in bed awake upon arriving. He reports he was about to take a nap however once encouraged was agreeable to session. He was able to exit bed with assistance, stand and ambulate ~ 100 ft total. Desaturation to 82 % without O2 applied. With 2 L o2 pt able to maintain > 88%. Pt reports he lives with his son however per chart review, looks like pt is a resident at the [facility]. Pt will benefit from skilled PT at DC to address deficits with balance, endurance, and safety. He was returned to bed with call bell in reach and bed alarm reapplied at conclusion of session.     Follow Up Recommendations  Supervision/Assistance - 24 hour;Supervision for mobility/OOB;Home health PT;Other (comment) (return to [facility] with HHPT to follow)     Equipment Recommendations  None recommended by PT;Other (comment)    Recommendations for Other Services       Precautions / Restrictions Precautions Precautions: Fall Restrictions Weight Bearing Restrictions: No    Mobility  Bed Mobility Overal bed mobility: Needs Assistance Bed Mobility: Supine to Sit;Sit to Supine     Supine to sit: Min assist Sit to supine: Modified independent (Device/Increase time)      Transfers Overall transfer level: Needs assistance Equipment used: Rolling walker (2 wheeled) Transfers: Sit  to/from Stand Sit to Stand: Min guard         General transfer comment: CGA for safety. pt slightly unsteady upon standing but once balance is established demonstrated fair balance overall.  Ambulation/Gait Ambulation/Gait assistance: Min guard Gait Distance (Feet): 100 Feet Assistive device: Rolling walker (2 wheeled)   Gait velocity: decreased   General Gait Details: Pt ambulated with slow step through gait kinematics. Attempted to wean O2 however pt quickly drops to 82% during ambulation without O2       Balance Overall balance assessment: Needs assistance Sitting-balance support: Feet supported Sitting balance-Leahy Scale: Fair Sitting balance - Comments: patient able to sit unsupported at edge of bed.   Standing balance support: Bilateral upper extremity supported;During functional activity Standing balance-Leahy Scale: Fair Standing balance comment: highh fall risk due to lack of insight of deficits    Cognition Arousal/Alertness: Awake/alert Behavior During Therapy: Flat affect Overall Cognitive Status: No family/caregiver present to determine baseline cognitive functioning      General Comments: Patient's history does not match with his verbal history of prior level and home environment.             Pertinent Vitals/Pain Pain Assessment: No/denies pain           PT Goals (current goals can now be found in the care plan section) Acute Rehab PT Goals Patient Stated Goal: none stated Progress towards PT goals: Progressing toward goals    Frequency    Min  2X/week      PT Plan Current plan remains appropriate       AM-PAC PT "6 Clicks" Mobility   Outcome Measure  Help needed turning from your back to your side while in a flat bed without using bedrails?: None Help needed moving from lying on your back to sitting on the side of a flat bed without using bedrails?: A Little Help needed moving to and from a bed to a chair (including a wheelchair)?:  A Little Help needed standing up from a chair using your arms (e.g., wheelchair or bedside chair)?: A Little Help needed to walk in hospital room?: A Little Help needed climbing 3-5 steps with a railing? : A Little 6 Click Score: 19    End of Session Equipment Utilized During Treatment: Gait belt;Oxygen Activity Tolerance: Patient limited by fatigue Patient left: in bed;with bed alarm set Nurse Communication: Mobility status PT Visit Diagnosis: Muscle weakness (generalized) (M62.81);Other abnormalities of gait and mobility (R26.89)     Time: 2993-7169 PT Time Calculation (min) (ACUTE ONLY): 15 min  Charges:  $Gait Training: 8-22 mins                     Julaine Fusi PTA 01/06/20, 3:24 PM

## 2020-01-06 NOTE — Consult Note (Signed)
Consultation Note Date: 01/06/2020   Patient Name: Troy Booker  DOB: Oct 05, 1940  MRN: [id number]  Age / Sex: 79 y.o., male  PCP: Patient, No Pcp Per Referring Physician: Antonieta Pert, MD  Reason for Consultation: Establishing goals of care  HPI/Patient Profile: 79 y.o. male  with past medical history of depression, dementia, HTN, and CKD admitted on 01/04/2020 with RLL pna. CT chest concerning for lower lobe mass/malignancy with osseous, hepatic, adrenal, and pancreatic metastasis. Also with hypercalcemia. PMT consulted to discuss [place].  Clinical Assessment and Goals of Care: I have reviewed medical records including EPIC notes, labs and imaging, assessed the patient and then spoke with patient's son Lexie  to discuss diagnosis prognosis, [place], EOL wishes, disposition and options.  Patient unable to engage in goals of care d/t confusion/dementia.   I introduced Palliative Medicine as specialized medical care for people living with serious illness. It focuses on providing relief from the symptoms and stress of a serious illness. The goal is to improve quality of life for both the patient and the family.  Adrian shares about patient's history - spouse passed away ~15 years ago and patient "gave up" for a while. Had a time period where he lost of lot of weight and stopped ambulating. APS was involved. After this period patient did improve some - gained weight back. However, Nazir tells me he has noticed a steady decline recently. Specifically loss of function and appetite. He also speaks of increasing confusion.    We discussed patient's current illness and what it means in the larger context of patient's on-going co-morbidities.  Natural disease trajectory and expectations at EOL were discussed. We discussed patient's right lung mass with what looks like mets to ribs, liver, pancreas, and adrenals. We discussed likely poor prognosis d/t patient's  baseline frailty and likely limited treatment options.   I attempted to elicit values and goals of care important to the patient. Zaydin shares that the patient has already stated he would not want chemotherapy.    The difference between aggressive medical intervention and comfort care was considered in light of the patient's goals of care. Konner does not think the patient would be interested in aggressive measures. We discuss what a comfort approach would look like for patient. We discuss avoiding biopsy if we are not going to pursue aggressive interventions and Moosa agrees with this.   Encouraged Taksh to consider DNR/DNI status understanding evidenced based poor outcomes in similar hospitalized patients, as the cause of the arrest is likely associated with chronic/terminal disease rather than a reversible acute cardio-pulmonary event. Dywane agrees DNR is appropriate.   Hospice services outpatient were explained and offered. Discussed philosophy of hospice care. Discussed patient returning to ALF with hospice services and Rober agrees.   Questions and concerns were addressed. Jasen was encouraged to call with questions or concerns.   Primary Decision Maker NEXT OF KIN son Lealand Elting   SUMMARY OF RECOMMENDATIONS   - comfort measures, no further workup/no biopsy - back to ALF with hospice services - code status DNR - will ask SLP to reevaluate regarding liberating diet w/comfort measures in mind  Code Status/Advance Care Planning:  DNR  Palliative Prophylaxis:   Frequent Pain Assessment  Additional Recommendations (Limitations, Scope, Preferences):  Full Comfort Care, No Chemotherapy and No Diagnostics  Prognosis:   < 6 months  Discharge Planning: ALF with hospice      Primary Diagnoses: Present on Admission: . Benign prostatic hyperplasia with urinary frequency . Essential  hypertension . Hypothyroidism . Major depression in remission [facility])   I have reviewed the  medical record, interviewed the patient and family, and examined the patient. The following aspects are pertinent.  Past Medical History:  Diagnosis Date  . Chronic kidney disease   . Depression   . FTT (failure to thrive) in adult   . Gout   . Hypertension   . PVD (peripheral vascular disease) ([place])    Social History   Socioeconomic History  . Marital status: Widowed    Spouse name: Not on file  . Number of children: Not on file  . Years of education: Not on file  . Highest education level: Not on file  Occupational History  . Not on file  Tobacco Use  . Smoking status: Never Smoker  . Smokeless tobacco: Never Used  Substance and Sexual Activity  . Alcohol use: Not Currently  . Drug use: Never  . Sexual activity: Not on file  Other Topics Concern  . Not on file  Social History Narrative  . Not on file   Social Determinants of Health   Financial Resource Strain: Not on file  Food Insecurity: Not on file  Transportation Needs: Not on file  Physical Activity: Not on file  Stress: Not on file  Social Connections: Not on file   History reviewed. No pertinent family history. Scheduled Meds: . allopurinol  100 mg Oral Daily  . [START ON 01/07/2020] azithromycin  500 mg Oral Daily  . DULoxetine  60 mg Oral Daily  . [START ON 01/07/2020] enoxaparin (LOVENOX) injection  40 mg Subcutaneous Q24H  . feeding supplement  1 Container Oral TID BM  . ipratropium-albuterol  3 mL Nebulization Q6H  . mirtazapine  7.5 mg Oral QHS  . [START ON 01/07/2020] predniSONE  20 mg Oral Q breakfast  . tamsulosin  0.4 mg Oral QHS   Continuous Infusions: . sodium chloride Stopped (01/06/20 0650)  . cefTRIAXone (ROCEPHIN)  IV Stopped (01/06/20 0547)   PRN Meds:.sodium chloride, acetaminophen, alum & mag hydroxide-simeth, simethicone No Known Allergies Review of Systems  Unable to perform ROS: Dementia    Physical Exam Constitutional:      General: He is not in acute  distress. Pulmonary:     Effort: Pulmonary effort is normal. No respiratory distress.  Skin:    General: Skin is warm and dry.  Neurological:     Mental Status: He is alert. He is disoriented.     Vital Signs: BP 136/69 (BP Location: Left Arm)   Pulse 90   Temp 97.8 F (36.6 C)   Resp 18   Ht 5\' 6"  (1.676 m)   Wt 82.1 kg Comment: bed scale  SpO2 91%   BMI 29.21 kg/m  Pain Scale: 0-10   Pain Score: 0-No pain   SpO2: SpO2: 91 % O2 Device:SpO2: 91 % O2 Flow Rate: .O2 Flow Rate (L/min): 2 L/min  IO: Intake/output summary:   Intake/Output Summary (Last 24 hours) at 01/06/2020 1538 Last data filed at 01/06/2020 1402 Gross per 24 hour  Intake 1080.87 ml  Output 825 ml  Net 255.87 ml    LBM: Last BM Date: 01/05/20 Baseline Weight: Weight: 96.6 kg Most recent weight: Weight: 82.1 kg (bed scale)     Palliative Assessment/Data: PPS 40%    Time Total: 75 minutes Greater than 50%  of this time was spent counseling and coordinating care related to the above assessment and plan.  Juel Burrow, DNP, AGNP-C Palliative Medicine Team  [phone] Pager: [phone]

## 2020-01-06 NOTE — Progress Notes (Signed)
Palliative:  Consult received and chart reviewed. Visited patient briefly - unable to participate in goals of care conversation independently. Called patient's son - no answer and unable to leave voice mail.  Will attempt again tomorrow.   Juel Burrow, DNP, AGNP-C Palliative Medicine Team Team Phone # [phone]  Pager # [phone]  NO CHARGE

## 2020-01-06 NOTE — TOC Initial Note (Signed)
Transition of Care [facility]) - Initial/Assessment Note    Patient Details  Name: Troy Booker MRN: [id number] Date of Birth: 1940-05-11  [facility] CM/SW Contact:    Magnus Ivan, LCSW Phone Number: 01/06/2020, 10:32 AM  Clinical Narrative:           Patient disoriented per chart review. CSW called and spoke with patient's son Brannen. Son reported patient lives at [facility] and has been there over a year. He reported he is patient's HCPOA. He reported at baseline patient does not use any DME. He reported his plan is for patient to discharge back to [facility] and is fine with Home Health being arranged and any DME being ordered if needed for patient.   CSW called and spoke to [place] and [place] at [facility]. They confirmed he is a resident there. They can provide transportation at time of discharge if it is before 5 pm. If after 5 pm, would have to use nonemergent EMS transport. Rachel Bo reported patient already has Home Health services through [facility].        Expected Discharge Plan: Assisted Living Barriers to Discharge: Continued Medical Work up   Patient Goals and CMS Choice Patient states their goals for this hospitalization and ongoing recovery are:: return to ALF with home health CMS Medicare.gov Compare Post Acute Care list provided to:: Patient Represenative (must comment) Choice offered to / list presented to : Adult Children  Expected Discharge Plan and Services Expected Discharge Plan: Assisted Living       Living arrangements for the past 2 months: [place]                                      Prior Living Arrangements/Services Living arrangements for the past 2 months: [place] Lives with:: Facility Resident Patient language and need for interpreter reviewed:: Yes Do you feel safe going back to the place where you live?: Yes      Need for Family Participation in Patient Care: Yes (Comment) Care giver  support system in place?: Yes (comment) Current home services: Home PT Criminal Activity/Legal Involvement Pertinent to Current Situation/Hospitalization: No - Comment as needed  Activities of Daily Living Home Assistive Devices/Equipment: None ADL Screening (condition at time of admission) Patient's cognitive ability adequate to safely complete daily activities?: No Is the patient deaf or have difficulty hearing?: No Does the patient have difficulty seeing, even when wearing glasses/contacts?: No Does the patient have difficulty concentrating, remembering, or making decisions?: No Patient able to express need for assistance with ADLs?: Yes Does the patient have difficulty dressing or bathing?: Yes Independently performs ADLs?: No Communication: Independent Dressing (OT): Needs assistance Is this a change from baseline?: Pre-admission baseline Grooming: Needs assistance Is this a change from baseline?: Pre-admission baseline Feeding: Independent Bathing: Needs assistance Is this a change from baseline?: Pre-admission baseline Toileting: Needs assistance Is this a change from baseline?: Pre-admission baseline In/Out Bed: Needs assistance Is this a change from baseline?: Pre-admission baseline Walks in Home: Needs assistance Is this a change from baseline?: Pre-admission baseline Does the patient have difficulty walking or climbing stairs?: Yes Weakness of Legs: Both Weakness of Arms/Hands: None  Permission Sought/Granted Permission sought to share information with : Facility Sport and exercise psychologist (by son) Permission granted to share information with : Yes, Verbal Permission Granted     Permission granted to share info w  AGENCY: [facility], Home Health        Emotional Assessment       Orientation: : Fluctuating Orientation (Suspected and/or reported Sundowners) Alcohol / Substance Use: Not Applicable Psych Involvement: No (comment)  Admission diagnosis:  Acute  respiratory failure with hypoxia ([place]) [J96.01] RLL pneumonia [J18.9] Community acquired pneumonia of right lower lobe of lung [J18.9] Patient Active Problem List   Diagnosis Date Noted  . Acute kidney injury superimposed on CKD ([place])   . Essential hypertension 01/04/2020  . Hypothyroidism 01/04/2020  . Stage 3a chronic kidney disease ([place]) 01/04/2020  . RLL pneumonia 01/04/2020  . Hypoxia 01/04/2020  . Type 2 diabetes mellitus without complications ([place]) 15/18/3437  . Sepsis with acute hypoxic respiratory failure without septic shock ([place])   . Lobar pneumonia ([place])   . Acute respiratory failure with hypoxia ([place])   . Dehydration   . Hypercalcemia   . Benign prostatic hyperplasia with urinary frequency 05/11/2016  . Dementia with behavioral disturbance ([place]) 05/11/2016  . Major depression in remission ([place]) 05/11/2016   PCP:  Patient, No Pcp Per Pharmacy:  No Pharmacies Listed    Social Determinants of Health (SDOH) Interventions    Readmission Risk Interventions No flowsheet data found.

## 2020-01-06 NOTE — Consult Note (Signed)
Date: 01/06/2020,   MRN# [id number] Troy Booker 1940-05-25 Code Status:     Code Status Orders  (From admission, onward)         Start     Ordered   01/04/20 0432  Full code  Continuous        01/04/20 0433        Code Status History    This patient has a current code status but no historical code status.   Advance Care Planning Activity    Advance Directive Documentation   Flowsheet Row Most Recent Value  Type of Advance Directive Healthcare Power of Attorney  Pre-existing out of facility DNR order (yellow form or pink MOST form) --  "MOST" Form in Place? --     Hosp day:@LENGTHOFSTAYDAYS @ Referring MD: @ATDPROV @     PCP:  CC: Abnormal ct scan  HPI: This is an 79 year old male, ex smoker, come in today sob, pneumonia, copd, large right lung mass with what looks like mets to ribs, liver, pancreas and adrenals.   He also has hypercalcemia. The prognosis for him is poor.   PMHX:   Past Medical History:  Diagnosis Date  . Chronic kidney disease   . Depression   . FTT (failure to thrive) in adult   . Gout   . Hypertension   . PVD (peripheral vascular disease) ([place])    Surgical Hx:  History reviewed. No pertinent surgical history. Family Hx:  History reviewed. No pertinent family history. Social Hx:   Social History   Tobacco Use  . Smoking status: Never Smoker  . Smokeless tobacco: Never Used  Substance Use Topics  . Alcohol use: Not Currently  . Drug use: Never   Medication:    Home Medication:    Current Medication: @CURMEDTAB @   Allergies:  Patient has no known allergies.  Review of Systems: Gen:  Denies  fever, sweats, chills HEENT: Denies blurred vision, double vision, ear pain, eye pain, hearing loss, nose bleeds, sore throat Cvc:  No dizziness, chest pain or heaviness Resp:  Sob, no chest pain, hemoptysis.   Gi: Denies swallowing difficulty, stomach pain, nausea or vomiting, diarrhea, constipation, bowel incontinence Gu:  Denies  bladder incontinence, burning urine Ext:   No Joint pain, stiffness or swelling Skin: No skin rash, easy bruising or bleeding or hives Endoc:  No polyuria, polydipsia , polyphagia or weight change Psych: No depression, insomnia or hallucinations  Other:  All other systems negative  Physical Examination:   VS: BP 140/71 (BP Location: Left Arm)   Pulse 86   Temp 98.1 F (36.7 C)   Resp 17   Ht 5\' 6"  (1.676 m)   Wt 82.1 kg Comment: bed scale  SpO2 96%   BMI 29.21 kg/m   General Appearance: No distress, sitting up in bed, [name] 02 on poor historian. Son is at work, frial  Neuro: without focal findings, mental status, speech normal, alert and oriented, cranial nerves 2-12 intact, reflexes normal and symmetric, sensation grossly normal  HEENT: PERRLA, EOM intact, no ptosis, temporal wasting Pulmonary:.No wheezing, No rales  decreased bs t the base, coarse Cardiovascular:  Normal S1,S2.  No m/r/g.  Abdominal aorta pulsation normal.    Abdomen:Benign, Soft, non-tender, No masses, hepatosplenomegaly, No lymphadenopathy Endoc: No evident thyromegaly, no signs of acromegaly or Cushing features Skin:   warm, no rashes, no ecchymosis  Extremities: normal, no cyanosis, clubbing, no edema, warm with normal capillary refill. Other findings:   Labs results:  Recent Labs    01/04/20 0344 01/04/20 1621 01/05/20 0832 01/06/20 0413  HGB 13.3  --   --  10.0*  HCT 45.2  --   --  32.8*  MCV 81.9  --   --  80.0  WBC 12.5*  --   --  13.6*  BUN 16 15 18 19   CREATININE 1.44* 1.15 1.12 1.03  GLUCOSE 158* 166* 179* 150*  CALCIUM 11.0* 9.9 10.1 10.3  INR 1.1  --   --   --           CONTRAST:  84mL OMNIPAQUE IOHEXOL 350 MG/ML SOLN  COMPARISON:  None.  FINDINGS: Cardiovascular: Top-normal cardiac size. No pericardial effusion. There is coronary vascular calcification. There is moderate atherosclerotic calcification of the thoracic aorta. No aneurysmal dilatation or dissection. The  origins of the great vessels of the aortic arch appear patent as visualized. No pulmonary artery embolus identified.  Mediastinum/Nodes: Subcarinal adenopathy measures 17 mm in short axis. Right hilar adenopathy measures 16 mm in short axis. The esophagus is grossly unremarkable. No mediastinal fluid collection.  Lungs/Pleura: There is a large area of masslike consolidation in the right lower lobe extending to the right hilum. The hypoattenuating masslike density in the right hilar/infrahilar region extending to the posterior and lateral basal segments and pleural surface measures approximately 11 x 8 cm in greatest axial dimensions (202/5) and 7 cm in craniocaudal length.  There is associated mass effect and high-grade narrowing of the bronchus intermedius and complete occlusion of the right middle and right lower lobe bronchi. There is additionally mass effect and splaying of the right lower lobe pulmonary artery branches.  There is a background of moderate centrilobular emphysema. Left lung base subsegmental atelectasis. There are small bilateral pleural effusions. There is slight thickening and nodularity of the posterior right pleural surface concerning for metastatic implants. No pneumothorax.  Upper Abdomen: Bilateral adrenal masses measuring up to 4.5 x 3.0 cm on the right consistent with metastatic disease. There is a large area of hypoattenuation involving the left lobe of the liver most likely metastatic disease. Hypoperfusion/infarcts secondary to portal vein thrombosis is less likely but not excluded. Evaluation of the patency of the main portal vein is limited on this CT in the absence of delayed/portal phase imaging. Duplex ultrasound may provide better evaluation and assess patency of the main portal vein. Hypoattenuating lesion in the caudate lobe also consistent with metastatic disease.  There is ill-defined hypoattenuating lesion in the distal  body/tail of the pancreas most consistent with metastasis.  Musculoskeletal: Old right rib fractures. No acute fracture. There is a 3.5 x 2.5 cm metastatic lytic/destructive lesion involving the left second rib.  Review of the MIP images confirms the above findings.  IMPRESSION: 1. No CT evidence of pulmonary artery embolus. 2. Large right infrahilar/right lower lobe mass/malignancy with mass effect and occlusion of the right middle and right lower lobe bronchi. 3. Nodal, osseous, hepatic, adrenal, and pancreatic metastatic disease. 4. Aortic Atherosclerosis (ICD10-I70.0) and Emphysema (ICD10-J43.9).   Electronically Signed   By: Anner Crete M.D.   On: 01/05/2020 16:04  Assessment and Plan: The picture is pneumonia with copd, ex smoker with lung cancer, mets to multiple sites and hypercalcemia. The question is how aggressive does the family want [place] to be.  -will continue pneumonia/copd  regimen as is -hydration -palliative -will try to reach son -if he wants everything done consider adrenal vs liver bx  -thanks for the consult, following with you  I have personally obtained a history, examined the patient, evaluated laboratory and imaging results, formulated the assessment and plan and placed orders.  The Patient requires high complexity decision making for assessment and support, frequent evaluation and titration of therapies, application of advanced monitoring technologies and extensive interpretation of multiple databases.   Dayanne Yiu,M.D. Pulmonary & Critical care Medicine [facility]

## 2020-01-07 DIAGNOSIS — R627 Adult failure to thrive: Secondary | ICD-10-CM

## 2020-01-07 DIAGNOSIS — R918 Other nonspecific abnormal finding of lung field: Secondary | ICD-10-CM

## 2020-01-07 DIAGNOSIS — Z515 Encounter for palliative care: Secondary | ICD-10-CM

## 2020-01-07 DIAGNOSIS — Z7189 Other specified counseling: Secondary | ICD-10-CM

## 2020-01-07 NOTE — Progress Notes (Addendum)
[facility]) Hospital Liaison RN note:  Received request from Kathie Rhodes, NP for hospice services at [facility] after discharge. Chart and patient information under review by [facility] physician. Hospice eligibility has been approved.  Spoke with son, Can, over the phone to initiate education related to hospice philosophy, services and to answer any questions. Qu[place]on verbalized understanding of information given. Per discussion, the plan is for discharge to [facility] tomorrow by EMS.  DME needs discussed. Family requests the following equipment to be delivered to [facility] Bed, OBT, BSC, O2.Pandora Leiter, [place] is the contact and his number is [phone].  Please send signed and completed DNR back with patient. Please provide prescriptions at discharge as needed to ensure ongoing symptom management.   [facility] information and contact numbers provided to [place]. Above information shared with hospital care team.  Please call with any hospice related questions or concerns.  Thank you for the opportunity to participate in this patient's care.  Zandra Abts, RN [facility] Liaison [phone]

## 2020-01-07 NOTE — TOC Progression Note (Addendum)
Transition of Care [facility]) - Progression Note    Patient Details  Name: Troy Booker MRN: [id number] Date of Birth: 20-Oct-1940  Transition of Care [facility]) CM/SW [place], LCSW Phone Number: 01/07/2020, 1:59 PM  Clinical Narrative:      CSW was informed by Palliative NP that she spoke with son and that plan is back to ALF with hospice. CSW spoke with Rachel Bo at [facility] who confirmed they can take patient back with hospice and that they use [facility]. Referral made to [employer]. She is reviewing patient for hospice eligbility and will reach out to patient's son.  CSW asked Rachel Bo about what DME is needed for hospice to order prior to discharge. Rachel Bo reported patient does not have any DME, so anything patient needs will need to be ordered. CSW informed Kieth Brightly with [facility] that patient will need DME including hospital bed and oxygen as well as whatever else is recommended/needed. Kieth Brightly is working on this.    Expected Discharge Plan: Assisted Living Barriers to Discharge: Continued Medical Work up  Expected Discharge Plan and Services Expected Discharge Plan: Assisted Living       Living arrangements for the past 2 months: Assisted Living Facility                                       Social Determinants of Health (SDOH) Interventions    Readmission Risk Interventions No flowsheet data found.

## 2020-01-07 NOTE — Progress Notes (Signed)
PROGRESS NOTE    Troy Booker  SEG:[id number] DOB: December 28, 1940 DOA: 01/04/2020 PCP: Patient, No Pcp Per   Chief Complaint  Patient presents with   Weakness   Brief Narrative: 79 year old male with history of depression/dementia, hypertension, CKD stage III, BPH brought by EMS with report that patient "rolled out of bed" on nursing home staff went to check on him, has been coughing and complaining of weakness and shortness of breath, found to be hypoxic after 87% by EMS. Patient was brought to the ED, met sepsis criteria with tachycardia, leukocytosis, elevated creatinine, calcium.  Patient was admitted for further management.  X-ray showed right basilar consolidation possibly reflecting changes of acute lobar pneumonia.  Patient was placed on IV antibiotics. CT chest 12/14 showed concern for lung malignancy with metastasis 12/15: Pulmonary and palliative was consulted.  Subjective:  Slept good, coughing a lot he says abt to have breakfast, On [place] o2 saturating well Afebrile overnight Labs overall stable except mild leukocytosis.  Assessment & Plan:  Severe sepsis with  RLL pneumonia/possible malignancy: CT chest scan concerning for lower lobe mass/malignancy with osseous hepatic adrenal and pancreatic metastasis with Large right infrahilar/right lower lobe mass/malignancy with mass effect and occlusion of the right middle and right lower lobe bronchi.  I had discussed with Dr. Serafina Royals IR advised to c.w pulm.  Palliative consulted to see how aggressive patient and family would like to be.Patient aware abt diagnosis.  For now continue on Rocephin/azithromycin oral steroid and bronchodilators.  Await family meeting w/ palliative today to d/w further to proceed biopsy or not.   Acute hypoxic respiratory failure:Patient is needing 2 L nasal prong oxygen in the setting of #1.  Wean oxygen as tolerated.    Hypercalcemia likely for malignancy, initially thought to be due to  dehydration-calcium down to 10.3, TSH normal vitamin D normal, PTH is appropriately suppressed. SPEP no e/o monoclonal protein. PTH RP pending  Acute kidney injury on CKD stage II-resolved from 1.4>1.0  VPX:TGGY his Flomax.  Dementia with behavioral disturbance -fall precautions, supportive care.  Mental status overall stable  Essential hypertension: Blood pressure control Home meds on hold   Hypothyroidism: Continue home Synthroid.  Major depression in remission mood is stable on Cymbalta.  Neuropathy-holding home Neurontin for now. History of gout no acute joint pain.  Continue low-dose allopurinol Debility/deconditioning PT OT on board  Goals of care palliative care consulted to discuss further. Hopefully today after family meeting we will have plan further.  Nutrition: Diet Order            DIET - DYS 1 Room service appropriate? Yes; Fluid consistency: Thin  Diet effective now                 Body mass index is 29.21 kg/m. DVT prophylaxis: lovenox Code Status:   Code Status: Full Code  Family Communication: plan of care discussed with patient at bedside.  Status IR:SWNIOEVOJ Remains inpatient appropriate because:IV treatments appropriate due to intensity of illness or inability to take PO and Inpatient level of care appropriate due to severity of illness  Dispo:The patient is from: ALF            Anticipated d/c is to: ALF            Anticipated d/c date is:1 day pending palliative meeting and further plan.            Patient currently is not medically stable to d/c.  Consultants:see note  Procedures:see note  Culture/Microbiology  Component Value Date/Time   SDES BLOOD LEFT AC 01/04/2020 0602   SDES BLOOD RIGHT WRIST 01/04/2020 0602   SPECREQUEST  01/04/2020 0602    BOTTLES DRAWN AEROBIC AND ANAEROBIC Blood Culture adequate volume   SPECREQUEST  01/04/2020 0602    BOTTLES DRAWN AEROBIC AND ANAEROBIC Blood Culture results may not be optimal due to an  inadequate volume of blood received in culture bottles   CULT  01/04/2020 0602    NO GROWTH 3 DAYS Performed at [facility], [address] [place], [place] [address]    CULT  01/04/2020 0602    NO GROWTH 3 DAYS Performed at [facility], [address] [place], [place] [address]    REPTSTATUS PENDING 01/04/2020 0602   REPTSTATUS PENDING 01/04/2020 0602    Other culture-see note  Medications: Scheduled Meds:  allopurinol  100 mg Oral Daily   azithromycin  500 mg Oral Daily   DULoxetine  60 mg Oral Daily   enoxaparin (LOVENOX) injection  40 mg Subcutaneous Q24H   feeding supplement  1 Container Oral TID BM   mirtazapine  7.5 mg Oral QHS   predniSONE  20 mg Oral Q breakfast   tamsulosin  0.4 mg Oral QHS   Continuous Infusions:  sodium chloride Stopped (01/06/20 0650)   cefTRIAXone (ROCEPHIN)  IV 2 g (01/07/20 0409)    Antimicrobials: Anti-infectives (From admission, onward)   Start     Dose/Rate Route Frequency Ordered Stop   01/07/20 1000  azithromycin (ZITHROMAX) tablet 500 mg        500 mg Oral Daily 01/06/20 1221 01/09/20 0959   01/05/20 0430  cefTRIAXone (ROCEPHIN) 2 g in sodium chloride 0.9 % 100 mL IVPB        2 g 200 mL/hr over 30 Minutes Intravenous Every 24 hours 01/04/20 0620 01/09/20 0429   01/04/20 0445  cefTRIAXone (ROCEPHIN) 2 g in sodium chloride 0.9 % 100 mL IVPB  Status:  Discontinued        2 g 200 mL/hr over 30 Minutes Intravenous Every 24 hours 01/04/20 0433 01/04/20 0619   01/04/20 0445  azithromycin (ZITHROMAX) 500 mg in sodium chloride 0.9 % 250 mL IVPB  Status:  Discontinued        500 mg 250 mL/hr over 60 Minutes Intravenous Every 24 hours 01/04/20 0433 01/06/20 1221   01/04/20 0430  cefTRIAXone (ROCEPHIN) 2 g in sodium chloride 0.9 % 100 mL IVPB        2 g 200 mL/hr over 30 Minutes Intravenous  Once 01/04/20 0416 01/04/20 0557   01/04/20 0430  azithromycin (ZITHROMAX) 500 mg in sodium chloride 0.9 % 250 mL IVPB   Status:  Discontinued        500 mg 250 mL/hr over 60 Minutes Intravenous  Once 01/04/20 0416 01/04/20 0618     Objective: Vitals: Today's Vitals   01/06/20 1939 01/06/20 2200 01/06/20 2351 01/07/20 0443  BP: (!) 152/80  (!) 148/72 (!) 150/71  Pulse: 87  85 83  Resp: 18  17 16   Temp: 97.7 F (36.5 C)  98.1 F (36.7 C) 97.8 F (36.6 C)  TempSrc: Oral  Oral   SpO2: 96%  96% 95%  Weight:      Height:      PainSc:  0-No pain      Intake/Output Summary (Last 24 hours) at 01/07/2020 0758 Last data filed at 01/07/2020 0600 Gross per 24 hour  Intake 480 ml  Output 1650 ml  Net -1170  ml   Filed Weights   01/04/20 1108 01/06/20 0958  Weight: 96.6 kg 82.1 kg   Weight change:   Intake/Output from previous day: 12/15 0701 - 12/16 0700 In: 480 [P.O.:480] Out: 1650 [Urine:1650] Intake/Output this shift: No intake/output data recorded.  Examination: General exam: AAOx3, NAD, weak appearing. HEENT:Oral mucosa moist, Ear/Nose WNL grossly, dentition normal. Respiratory system: bilaterally basal crackles,no wheezing or crackles,no use of accessory muscle Cardiovascular system: S1 & S2 +, No JVD,. Gastrointestinal system: Abdomen soft, NT,ND, BS+ Nervous System:Alert, awake, moving extremities and grossly nonfocal Extremities: No edema, distal peripheral pulses palpable.  Skin: No rashes,no icterus. Chronic skin changes on leg MSK: Normal muscle bulk,tone, power  Data Reviewed: I have personally reviewed following labs and imaging studies CBC: Recent Labs  Lab 01/04/20 0344 01/06/20 0413  WBC 12.5* 13.6*  NEUTROABS 10.1*  --   HGB 13.3 10.0*  HCT 45.2 32.8*  MCV 81.9 80.0  PLT 587* 119   Basic Metabolic Panel: Recent Labs  Lab 01/04/20 0344 01/04/20 1621 01/05/20 0832 01/06/20 0413  NA 139 137 139 140  K 3.7 3.4* 3.2* 3.8  CL 95* 100 101 100  CO2 29 26 27 29   GLUCOSE 158* 166* 179* 150*  BUN 16 15 18 19   CREATININE 1.44* 1.15 1.12 1.03  CALCIUM 11.0* 9.9  10.1 10.3   GFR: Estimated Creatinine Clearance: 56.6 mL/min (by C-G formula based on SCr of 1.03 mg/dL). Liver Function Tests: No results for input(s): AST, ALT, ALKPHOS, BILITOT, PROT, ALBUMIN in the last 168 hours. No results for input(s): LIPASE, AMYLASE in the last 168 hours. No results for input(s): AMMONIA in the last 168 hours. Coagulation Profile: Recent Labs  Lab 01/04/20 0344  INR 1.1   Cardiac Enzymes: No results for input(s): CKTOTAL, CKMB, CKMBINDEX, TROPONINI in the last 168 hours. BNP (last 3 results) No results for input(s): PROBNP in the last 8760 hours. HbA1C: No results for input(s): HGBA1C in the last 72 hours. CBG: Recent Labs  Lab 01/04/20 0955 01/04/20 1205 01/04/20 1551  GLUCAP 110* 114* 150*   Lipid Profile: No results for input(s): CHOL, HDL, LDLCALC, TRIG, CHOLHDL, LDLDIRECT in the last 72 hours. Thyroid Function Tests: Recent Labs    01/04/20 1621  TSH 0.710   Anemia Panel: No results for input(s): VITAMINB12, FOLATE, FERRITIN, TIBC, IRON, RETICCTPCT in the last 72 hours. Sepsis Labs: Recent Labs  Lab 01/04/20 0344 01/04/20 0602  LATICACIDVEN 3.2* 1.1    Recent Results (from the past 240 hour(s))  Resp Panel by RT-PCR (Flu A&B, Covid) Nasopharyngeal Swab     Status: None   Collection Time: 01/04/20  6:02 AM   Specimen: Nasopharyngeal Swab; Nasopharyngeal(NP) swabs in vial transport medium  Result Value Ref Range Status   SARS Coronavirus 2 by RT PCR NEGATIVE NEGATIVE Final    Comment: (NOTE) SARS-CoV-2 target nucleic acids are NOT DETECTED.  The SARS-CoV-2 RNA is generally detectable in upper respiratory specimens during the acute phase of infection. The lowest concentration of SARS-CoV-2 viral copies this assay can detect is 138 copies/mL. A negative result does not preclude SARS-Cov-2 infection and should not be used as the sole basis for treatment or other patient management decisions. A negative result may occur with   improper specimen collection/handling, submission of specimen other than nasopharyngeal swab, presence of viral mutation(s) within the areas targeted by this assay, and inadequate number of viral copies(<138 copies/mL). A negative result must be combined with clinical observations, patient history, and epidemiological information. The  expected result is Negative.  Fact Sheet for Patients:  EntrepreneurPulse.com.au  Fact Sheet for Healthcare Providers:  IncredibleEmployment.be  This test is no t yet approved or cleared by the [place] FDA and  has been authorized for detection and/or diagnosis of SARS-CoV-2 by FDA under an Emergency Use Authorization (EUA). This EUA will remain  in effect (meaning this test can be used) for the duration of the COVID-19 declaration under Section 564(b)(1) of the Act, 21 U.S.C.section 360bbb-3(b)(1), unless the authorization is terminated  or revoked sooner.       Influenza A by PCR NEGATIVE NEGATIVE Final   Influenza B by PCR NEGATIVE NEGATIVE Final    Comment: (NOTE) The Xpert Xpress SARS-CoV-2/FLU/RSV plus assay is intended as an aid in the diagnosis of influenza from Nasopharyngeal swab specimens and should not be used as a sole basis for treatment. Nasal washings and aspirates are unacceptable for Xpert Xpress SARS-CoV-2/FLU/RSV testing.  Fact Sheet for Patients: EntrepreneurPulse.com.au  Fact Sheet for Healthcare Providers: IncredibleEmployment.be  This test is not yet approved or cleared by the [place] FDA and has been authorized for detection and/or diagnosis of SARS-CoV-2 by FDA under an Emergency Use Authorization (EUA). This EUA will remain in effect (meaning this test can be used) for the duration of the COVID-19 declaration under Section 564(b)(1) of the Act, 21 U.S.C. section 360bbb-3(b)(1), unless the authorization is terminated  or revoked.  Performed at [facility], [address] [place], [place] [address]   Culture, blood (x 2)     Status: None (Preliminary result)   Collection Time: 01/04/20  6:02 AM   Specimen: BLOOD  Result Value Ref Range Status   Specimen Description BLOOD LEFT [facility]  Final   Special Requests   Final    BOTTLES DRAWN AEROBIC AND ANAEROBIC Blood Culture adequate volume   Culture   Final    NO GROWTH 3 DAYS Performed at [facility], [address], [place], [place] [address]    Report Status PENDING  Incomplete  Culture, blood (x 2)     Status: None (Preliminary result)   Collection Time: 01/04/20  6:02 AM   Specimen: BLOOD  Result Value Ref Range Status   Specimen Description BLOOD RIGHT WRIST  Final   Special Requests   Final    BOTTLES DRAWN AEROBIC AND ANAEROBIC Blood Culture results may not be optimal due to an inadequate volume of blood received in culture bottles   Culture   Final    NO GROWTH 3 DAYS Performed at [facility], [address], [place], [place] [address]    Report Status PENDING  Incomplete  MRSA PCR Screening     Status: None   Collection Time: 01/04/20  1:30 PM   Specimen: Nasal Mucosa; Nasopharyngeal  Result Value Ref Range Status   MRSA by PCR NEGATIVE NEGATIVE Final    Comment:        The GeneXpert MRSA Assay (FDA approved for NASAL specimens only), is one component of a comprehensive MRSA colonization surveillance program. It is not intended to diagnose MRSA infection nor to guide or monitor treatment for MRSA infections. Performed at [facility], [address] [place], [place] [address]      Radiology Studies: CT ANGIO CHEST PE W OR WO CONTRAST  Result Date: 01/05/2020 CLINICAL DATA:  79 year old male with concern for pulmonary embolism. EXAM: CT ANGIOGRAPHY CHEST WITH CONTRAST TECHNIQUE: Multidetector CT imaging of the chest was performed using the standard protocol  during bolus administration  of intravenous contrast. Multiplanar CT image reconstructions and MIPs were obtained to evaluate the vascular anatomy. CONTRAST:  15m OMNIPAQUE IOHEXOL 350 MG/ML SOLN COMPARISON:  None. FINDINGS: Cardiovascular: Top-normal cardiac size. No pericardial effusion. There is coronary vascular calcification. There is moderate atherosclerotic calcification of the thoracic aorta. No aneurysmal dilatation or dissection. The origins of the great vessels of the aortic arch appear patent as visualized. No pulmonary artery embolus identified. Mediastinum/Nodes: Subcarinal adenopathy measures 17 mm in short axis. Right hilar adenopathy measures 16 mm in short axis. The esophagus is grossly unremarkable. No mediastinal fluid collection. Lungs/Pleura: There is a large area of masslike consolidation in the right lower lobe extending to the right hilum. The hypoattenuating masslike density in the right hilar/infrahilar region extending to the posterior and lateral basal segments and pleural surface measures approximately 11 x 8 cm in greatest axial dimensions (202/5) and 7 cm in craniocaudal length. There is associated mass effect and high-grade narrowing of the bronchus intermedius and complete occlusion of the right middle and right lower lobe bronchi. There is additionally mass effect and splaying of the right lower lobe pulmonary artery branches. There is a background of moderate centrilobular emphysema. Left lung base subsegmental atelectasis. There are small bilateral pleural effusions. There is slight thickening and nodularity of the posterior right pleural surface concerning for metastatic implants. No pneumothorax. Upper Abdomen: Bilateral adrenal masses measuring up to 4.5 x 3.0 cm on the right consistent with metastatic disease. There is a large area of hypoattenuation involving the left lobe of the liver most likely metastatic disease. Hypoperfusion/infarcts secondary to portal vein thrombosis is less likely but not  excluded. Evaluation of the patency of the main portal vein is limited on this CT in the absence of delayed/portal phase imaging. Duplex ultrasound may provide better evaluation and assess patency of the main portal vein. Hypoattenuating lesion in the caudate lobe also consistent with metastatic disease. There is ill-defined hypoattenuating lesion in the distal body/tail of the pancreas most consistent with metastasis. Musculoskeletal: Old right rib fractures. No acute fracture. There is a 3.5 x 2.5 cm metastatic lytic/destructive lesion involving the left second rib. Review of the MIP images confirms the above findings. IMPRESSION: 1. No CT evidence of pulmonary artery embolus. 2. Large right infrahilar/right lower lobe mass/malignancy with mass effect and occlusion of the right middle and right lower lobe bronchi. 3. Nodal, osseous, hepatic, adrenal, and pancreatic metastatic disease. 4. Aortic Atherosclerosis (ICD10-I70.0) and Emphysema (ICD10-J43.9). Electronically Signed   By: AAnner CreteM.D.   On: 01/05/2020 16:04     LOS: 3 days   RAntonieta Pert MD [facility]  01/07/2020, 7:58 AM

## 2020-01-07 NOTE — Progress Notes (Signed)
Date: 01/07/2020,   MRN# [id number] Troy Booker 11-04-1940 Code Status:     Code Status Orders  (From admission, onward)         Start     Ordered   01/04/20 0432  Full code  Continuous        01/04/20 0433        Code Status History    This patient has a current code status but no historical code status.   Advance Care Planning Activity    Advance Directive Documentation   Flowsheet Row Most Recent Value  Type of Advance Directive Healthcare Power of Attorney  Pre-existing out of facility DNR order (yellow form or pink MOST form) --  "MOST" Form in Place? --     Hosp day:@LENGTHOFSTAYDAYS @ Referring MD: @ATDPROV @     PCP:  HPI: Un eventfull night, no new complaints. No breathing difficulty. Trying to make contact with son. See last note and hpi  PMHX:   Past Medical History:  Diagnosis Date  . Chronic kidney disease   . Depression   . FTT (failure to thrive) in adult   . Gout   . Hypertension   . PVD (peripheral vascular disease) ([place])    Surgical Hx:  History reviewed. No pertinent surgical history. Family Hx:  History reviewed. No pertinent family history. Social Hx:   Social History   Tobacco Use  . Smoking status: Never Smoker  . Smokeless tobacco: Never Used  Substance Use Topics  . Alcohol use: Not Currently  . Drug use: Never   Medication:    Home Medication:    Current Medication: @CURMEDTAB @   Allergies:  Patient has no known allergies.  Review of Systems: Gen:  Denies  fever, sweats, chills HEENT: Denies blurred vision, double vision, ear pain, eye pain, hearing loss, nose bleeds, sore throat Cvc:  No dizziness, chest pain or heaviness Resp:   Less sob Gi: Denies swallowing difficulty, stomach pain, nausea or vomiting, diarrhea, constipation, bowel incontinence Gu:  Denies bladder incontinence, burning urine Ext:   No Joint pain, stiffness or swelling Skin: No skin rash, easy bruising or bleeding or hives Endoc:  No polyuria,  polydipsia , polyphagia or weight change Psych: No depression, insomnia or hallucinations  Other:  All other systems negative  Physical Examination:   VS: BP (!) 150/71 (BP Location: Left Arm)   Pulse 83   Temp 97.8 F (36.6 C)   Resp 16   Ht 5\' 6"  (1.676 m)   Wt 82.1 kg Comment: bed scale  SpO2 95%   BMI 29.21 kg/m   General Appearance: No distress more talkative eating Neuro: without focal findings, mental status, speech normal, alert and oriented, cranial nerves 2-12 intact, reflexes normal and symmetric, sensation grossly normal  HEENT: PERRLA, EOM intact, no ptosis, no other lesions noticed, Pulmonary:.No wheezing, No rales  Sputum Production:   Cardiovascular:  Normal S1,S2.  No m/r/g.  Abdominal aorta pulsation normal.    Abdomen:Benign, Soft, non-tender, No masses, hepatosplenomegaly, No lymphadenopathy Endoc: No evident thyromegaly, no signs of acromegaly or Cushing features Skin:   warm, no rashes, no ecchymosis  Extremities: normal, no cyanosis, clubbing, no edema, warm with normal capillary refill. Other findings:   Labs results:   Recent Labs    01/04/20 1621 01/05/20 0832 01/06/20 0413  HGB  --   --  10.0*  HCT  --   --  32.8*  MCV  --   --  80.0  WBC  --   --  13.6*  BUN 15 18 19   CREATININE 1.15 1.12 1.03  GLUCOSE 166* 179* 150*  CALCIUM 9.9 10.1 10.3  ,     Assessment and Plan: IMPRESSION: 1. No CT evidence of pulmonary artery embolus. 2. Large right infrahilar/right lower lobe mass/malignancy with mass effect and occlusion of the right middle and right lower lobe bronchi. 3. Nodal, osseous, hepatic, adrenal, and pancreatic metastatic disease. 4. Aortic Atherosclerosis (ICD10-I70.0) and Emphysema (ICD10-J43.9).   Electronically Signed By: Anner Crete M.D. On: 01/05/2020 16:04  Assessment and Plan: The picture is pneumonia with copd, in an ex smoker with lung cancer, mets to multiple sites and hypercalcemia (better). The  question is how aggressive does the family want [place] to be.  -will continue pneumonia/copd  regimen as is -hydration -palliative consult -will try to reach son -if he wants everything done consider adrenal vs liver bx  -Following with you   I have personally obtained a history, examined the patient, evaluated laboratory and imaging results, formulated the assessment and plan and placed orders.  The Patient requires high complexity decision making for assessment and support, frequent evaluation and titration of therapies, application of advanced monitoring technologies and extensive interpretation of multiple databases.   Deshawna Mcneece,M.D. Pulmonary & Critical care Medicine [facility]

## 2020-01-08 DIAGNOSIS — C799 Secondary malignant neoplasm of unspecified site: Secondary | ICD-10-CM

## 2020-01-08 DIAGNOSIS — Z66 Do not resuscitate: Secondary | ICD-10-CM

## 2020-01-08 MED ORDER — IPRATROPIUM-ALBUTEROL 20-100 MCG/ACT IN AERS: 1.0000 | INHALATION_SPRAY | Freq: Four times a day (QID) | RESPIRATORY_TRACT | 0 refills | Status: AC | PRN

## 2020-01-08 MED ORDER — PREDNISONE 20 MG PO TABS: 20.0000 mg | ORAL_TABLET | Freq: Every day | ORAL | 0 refills | Status: AC

## 2020-01-08 MED ORDER — AMOXICILLIN-POT CLAVULANATE 875-125 MG PO TABS: 1.0000 | ORAL_TABLET | Freq: Two times a day (BID) | ORAL | 0 refills | Status: AC

## 2020-01-08 NOTE — Progress Notes (Signed)
Attempted to call [facility] 3 times to give report on patient for his discharge. Their voicemail was full. Patient received discharge instructions, scripts, and is transported on [facility] van.

## 2020-01-08 NOTE — Discharge Summary (Signed)
Physician Discharge Summary  Troy Booker KGM:[id number] DOB: 01-11-1941 DOA: 01/04/2020  PCP: Patient, No Pcp Per  Admit date: 01/04/2020 Discharge date: 01/08/2020  Admitted From: ALF Disposition:  ALF W/ HOSPICE  Recommendations for Outpatient Follow-up:  1. Follow up with PCP in 1-2 weeks 2. Please obtain BMP/CBC in one week 3. Please follow up on the following pending results:  Home Health:hospice  Equipment/Devices: none  Discharge Condition: Stable Code Status:   Code Status: DNR Diet recommendation:  Diet Order            DIET DYS 2 Room service appropriate? Yes with Assist; Fluid consistency: Thin  Diet effective now                 Brief/Interim Summary: 79 year old male with history of depression/dementia, hypertension, CKD stage III, BPH brought by EMS with report that patient "rolled out of bed" on nursing home staff went to check on him, has been coughing and complaining of weakness and shortness of breath, found to be hypoxic after 87% by EMS. Patient was brought to the ED, met sepsis criteria with tachycardia, leukocytosis, elevated creatinine, calcium.  Patient was admitted for further management.  X-ray showed right basilar consolidation possibly reflecting changes of acute lobar pneumonia.  Patient was placed on IV antibiotics. CT chest 12/14 showed concern for lung malignancy with metastasis 12/15: Pulmonary and palliative was consulted 12/16: Palliative care discussed with the patient and family decided not to pursue the biopsy and opted for hospice care at this time given patient's old age and and complex co-morbidities and new diagnosis. Pt appears comfortable, not in pain and is stable for discharge.  Discharge Diagnoses:   Severe sepsis with  RLL pneumonia with CT chest scan concerning for lower lobe mass/malignancy with osseous hepatic adrenal and pancreatic metastasis, with Large right infrahilar/right lower lobe mass/malignancy with mass effect  and occlusion of the right middle and right lower lobe bronchi.  I had discussed with Dr. Serafina Royals IR advised to c.w pulm.  Palliative consulted to see how aggressive patient and family would like to be.Patient aware abt diagnosis. Palliative care discussed with the patient and family decided not to pursue the biopsy and opted for hospice care at this time given patient's old age and and complex co-morbidities and new diagnosis. Cont po abx on D/C.  Acute hypoxic respiratory failure: cont on 2l [place]   Hypercalcemia likely for malignancy, initially thought to be due to dehydration-calcium down to 10.3, TSH normal vitamin D normal, PTH is appropriately suppressed. SPEP no e/o monoclonal protein. PTH RP pending.  Acute kidney injury on CKD stage II- resolved from 1.4>1.0  UYQ:IHKV Flomax.  Dementia with behavioral disturbance - Cont w/ fall precautions, supportive care.  Mental status overall stable  Essential hypertension: Bp controlled-Home meds on hold   Hypothyroidism: Continue home Synthroid.  Major depression in remission mood is stable on Cymbalta.  Neuropathy-holding home Neurontin for now. History of gout no acute joint pain.  Continue low-dose allopurinol Debility/deconditioning PT OT on board  Malnutrition of moderate degree: augment nutrition Diet at D/C: Dysphagia level 1(puree) w/ thin liquids; general aspiration precautions including slowing down when drinking - Smaller, Slower sips taking breaks intermittently; reduce impulsive drinking behaviors to lessen risk for aspiration. Pills Whole in Puree; tray setup and monitoring during meals. Reflux precautions  Goals of care palliative care consulted to discuss further. Hopefully today after family meeting we will have plan further   Consults:  Palliative, pulmonary, IR  Subjective:  Alert,awake, not in distress,  Discharge Exam: Vitals:   01/08/20 0437 01/08/20 0735  BP: (!) 144/68 140/73  Pulse: (!) 102 94   Resp: 18 18  Temp: (!) 97.5 F (36.4 C) 98.7 F (37.1 C)  SpO2: (!) 66% (!) 87%   General: Pt is alert, awake, not in acute distress Cardiovascular: RRR, S1/S2 +, no rubs, no gallops Respiratory: CTA bilaterally, no wheezing, no rhonchi Abdominal: Soft, NT, ND, bowel sounds + Extremities: no edema, no cyanosis  Discharge Instructions  Discharge Instructions    Discharge instructions   Complete by: As directed    Follow up with hospice at ALF   Increase activity slowly   Complete by: As directed      Allergies as of 01/08/2020   No Known Allergies     Medication List    STOP taking these medications   amLODipine 5 MG tablet Commonly known as: NORVASC     TAKE these medications   acetaminophen 325 MG tablet Commonly known as: TYLENOL Take 650 mg by mouth every 4 (four) hours as needed for mild pain, headache or fever.   allopurinol 100 MG tablet Commonly known as: ZYLOPRIM Take 100 mg by mouth daily.   alum & mag hydroxide-simeth 200-200-20 MG/5ML suspension Commonly known as: MAALOX/MYLANTA Take 30 mLs by mouth every 6 (six) hours as needed for indigestion or heartburn.   alum & mag hydroxide-simeth 200-200-20 MG/5ML suspension Commonly known as: MAALOX/MYLANTA Take 30 mLs by mouth every 4 (four) hours as needed (constipation).   amoxicillin-clavulanate 875-125 MG tablet Commonly known as: Augmentin Take 1 tablet by mouth 2 (two) times daily for 5 days.   diphenhydrAMINE 25 MG tablet Commonly known as: BENADRYL Take 25 mg by mouth every 6 (six) hours as needed (allergic reaction).   DULoxetine 60 MG capsule Commonly known as: CYMBALTA Take 60 mg by mouth daily.   gabapentin 300 MG capsule Commonly known as: NEURONTIN Take 300 mg by mouth 2 (two) times daily.   GAS RELIEF 125 MG Caps Generic drug: Simethicone Take 125 mg by mouth every 3 (three) hours as needed (increased gerd).   guaifenesin 100 MG/5ML syrup Commonly known as: ROBITUSSIN Take  200-300 mg by mouth every 4 (four) hours as needed for cough.   HYDROcodone-acetaminophen 5-325 MG tablet Commonly known as: NORCO/VICODIN Take 1 tablet by mouth every 6 (six) hours as needed for moderate pain.   Ipratropium-Albuterol 20-100 MCG/ACT Aers respimat Commonly known as: COMBIVENT Inhale 1 puff into the lungs every 6 (six) hours as needed for up to 30 doses for wheezing.   ketoconazole 2 % shampoo Commonly known as: NIZORAL Apply 1 application topically every Monday, Wednesday, and Friday.   loperamide 2 MG tablet Commonly known as: IMODIUM A-D Take 4 mg by mouth as needed for diarrhea or loose stools. Do not exceed 8 doses in 24 hours   magnesium hydroxide 400 MG/5ML suspension Commonly known as: MILK OF MAGNESIA Take 30 mLs by mouth daily as needed for mild constipation.   mirtazapine 15 MG tablet Commonly known as: REMERON Take 7.5 mg by mouth at bedtime.   predniSONE 20 MG tablet Commonly known as: DELTASONE Take 1 tablet (20 mg total) by mouth daily with breakfast for 5 days.   tamsulosin 0.4 MG Caps capsule Commonly known as: FLOMAX Take 0.4 mg by mouth at bedtime.       Follow-up Information    [facility] Follow up.   Contact information: [place]  [phone]             No Known Allergies  The results of significant diagnostics from this hospitalization (including imaging, microbiology, ancillary and laboratory) are listed below for reference.    Microbiology: Recent Results (from the past 240 hour(s))  Resp Panel by RT-PCR (Flu A&B, Covid) Nasopharyngeal Swab     Status: None   Collection Time: 01/04/20  6:02 AM   Specimen: Nasopharyngeal Swab; Nasopharyngeal(NP) swabs in vial transport medium  Result Value Ref Range Status   SARS Coronavirus 2 by RT PCR NEGATIVE NEGATIVE Final    Comment: (NOTE) SARS-CoV-2 target nucleic acids are NOT DETECTED.  The SARS-CoV-2 RNA is generally  detectable in upper respiratory specimens during the acute phase of infection. The lowest concentration of SARS-CoV-2 viral copies this assay can detect is 138 copies/mL. A negative result does not preclude SARS-Cov-2 infection and should not be used as the sole basis for treatment or other patient management decisions. A negative result may occur with  improper specimen collection/handling, submission of specimen other than nasopharyngeal swab, presence of viral mutation(s) within the areas targeted by this assay, and inadequate number of viral copies(<138 copies/mL). A negative result must be combined with clinical observations, patient history, and epidemiological information. The expected result is Negative.  Fact Sheet for Patients:  EntrepreneurPulse.com.au  Fact Sheet for Healthcare Providers:  IncredibleEmployment.be  This test is no t yet approved or cleared by the [place] FDA and  has been authorized for detection and/or diagnosis of SARS-CoV-2 by FDA under an Emergency Use Authorization (EUA). This EUA will remain  in effect (meaning this test can be used) for the duration of the COVID-19 declaration under Section 564(b)(1) of the Act, 21 U.S.C.section 360bbb-3(b)(1), unless the authorization is terminated  or revoked sooner.       Influenza A by PCR NEGATIVE NEGATIVE Final   Influenza B by PCR NEGATIVE NEGATIVE Final    Comment: (NOTE) The Xpert Xpress SARS-CoV-2/FLU/RSV plus assay is intended as an aid in the diagnosis of influenza from Nasopharyngeal swab specimens and should not be used as a sole basis for treatment. Nasal washings and aspirates are unacceptable for Xpert Xpress SARS-CoV-2/FLU/RSV testing.  Fact Sheet for Patients: EntrepreneurPulse.com.au  Fact Sheet for Healthcare Providers: IncredibleEmployment.be  This test is not yet approved or cleared by the [place] FDA  and has been authorized for detection and/or diagnosis of SARS-CoV-2 by FDA under an Emergency Use Authorization (EUA). This EUA will remain in effect (meaning this test can be used) for the duration of the COVID-19 declaration under Section 564(b)(1) of the Act, 21 U.S.C. section 360bbb-3(b)(1), unless the authorization is terminated or revoked.  Performed at [facility], [address] [place], [place] [address]   Culture, blood (x 2)     Status: None (Preliminary result)   Collection Time: 01/04/20  6:02 AM   Specimen: BLOOD  Result Value Ref Range Status   Specimen Description BLOOD LEFT [facility]  Final   Special Requests   Final    BOTTLES DRAWN AEROBIC AND ANAEROBIC Blood Culture adequate volume   Culture   Final    NO GROWTH 3 DAYS Performed at [facility], [address], [place], [place] [address]    Report Status PENDING  Incomplete  Culture, blood (x 2)     Status: None (Preliminary result)   Collection Time: 01/04/20  6:02 AM   Specimen: BLOOD  Result Value Ref Range Status   Specimen  Description BLOOD RIGHT WRIST  Final   Special Requests   Final    BOTTLES DRAWN AEROBIC AND ANAEROBIC Blood Culture results may not be optimal due to an inadequate volume of blood received in culture bottles   Culture   Final    NO GROWTH 3 DAYS Performed at [facility], [address] [place], [place] [address]    Report Status PENDING  Incomplete  MRSA PCR Screening     Status: None   Collection Time: 01/04/20  1:30 PM   Specimen: Nasal Mucosa; Nasopharyngeal  Result Value Ref Range Status   MRSA by PCR NEGATIVE NEGATIVE Final    Comment:        The GeneXpert MRSA Assay (FDA approved for NASAL specimens only), is one component of a comprehensive MRSA colonization surveillance program. It is not intended to diagnose MRSA infection nor to guide or monitor treatment for MRSA infections. Performed at [facility], [address]  [place], [place] [address]     Procedures/Studies: CT ANGIO CHEST PE W OR WO CONTRAST  Result Date: 01/05/2020 CLINICAL DATA:  79 year old male with concern for pulmonary embolism. EXAM: CT ANGIOGRAPHY CHEST WITH CONTRAST TECHNIQUE: Multidetector CT imaging of the chest was performed using the standard protocol during bolus administration of intravenous contrast. Multiplanar CT image reconstructions and MIPs were obtained to evaluate the vascular anatomy. CONTRAST:  37m OMNIPAQUE IOHEXOL 350 MG/ML SOLN COMPARISON:  None. FINDINGS: Cardiovascular: Top-normal cardiac size. No pericardial effusion. There is coronary vascular calcification. There is moderate atherosclerotic calcification of the thoracic aorta. No aneurysmal dilatation or dissection. The origins of the great vessels of the aortic arch appear patent as visualized. No pulmonary artery embolus identified. Mediastinum/Nodes: Subcarinal adenopathy measures 17 mm in short axis. Right hilar adenopathy measures 16 mm in short axis. The esophagus is grossly unremarkable. No mediastinal fluid collection. Lungs/Pleura: There is a large area of masslike consolidation in the right lower lobe extending to the right hilum. The hypoattenuating masslike density in the right hilar/infrahilar region extending to the posterior and lateral basal segments and pleural surface measures approximately 11 x 8 cm in greatest axial dimensions (202/5) and 7 cm in craniocaudal length. There is associated mass effect and high-grade narrowing of the bronchus intermedius and complete occlusion of the right middle and right lower lobe bronchi. There is additionally mass effect and splaying of the right lower lobe pulmonary artery branches. There is a background of moderate centrilobular emphysema. Left lung base subsegmental atelectasis. There are small bilateral pleural effusions. There is slight thickening and nodularity of the posterior right pleural surface concerning for metastatic  implants. No pneumothorax. Upper Abdomen: Bilateral adrenal masses measuring up to 4.5 x 3.0 cm on the right consistent with metastatic disease. There is a large area of hypoattenuation involving the left lobe of the liver most likely metastatic disease. Hypoperfusion/infarcts secondary to portal vein thrombosis is less likely but not excluded. Evaluation of the patency of the main portal vein is limited on this CT in the absence of delayed/portal phase imaging. Duplex ultrasound may provide better evaluation and assess patency of the main portal vein. Hypoattenuating lesion in the caudate lobe also consistent with metastatic disease. There is ill-defined hypoattenuating lesion in the distal body/tail of the pancreas most consistent with metastasis. Musculoskeletal: Old right rib fractures. No acute fracture. There is a 3.5 x 2.5 cm metastatic lytic/destructive lesion involving the left second rib. Review of the MIP images confirms the above findings. IMPRESSION: 1. No CT  evidence of pulmonary artery embolus. 2. Large right infrahilar/right lower lobe mass/malignancy with mass effect and occlusion of the right middle and right lower lobe bronchi. 3. Nodal, osseous, hepatic, adrenal, and pancreatic metastatic disease. 4. Aortic Atherosclerosis (ICD10-I70.0) and Emphysema (ICD10-J43.9). Electronically Signed   By: Anner Crete M.D.   On: 01/05/2020 16:04   DG Chest Portable 1 View  Result Date: 01/04/2020 CLINICAL DATA:  Weakness, dyspnea EXAM: PORTABLE CHEST 1 VIEW COMPARISON:  12/25/2011 FINDINGS: Right basilar consolidation has developed, new since prior examination,. Left lung is clear. No pneumothorax or pleural effusion. Cardiac size within normal limits. Pulmonary vascularity is normal. No acute bone abnormality. IMPRESSION: Right basilar consolidation, possibly reflecting changes of acute lobar pneumonia in the appropriate clinical setting. In absence of an appropriate clinical history, contrast  enhanced CT examination would be helpful to exclude an underlying pulmonary mass. Follow-up chest radiograph is recommended in 4-6 weeks, following conservative therapy, to document complete resolution. Electronically Signed   By: Fidela Salisbury MD   On: 01/04/2020 04:10    Labs: BNP (last 3 results) No results for input(s): BNP in the last 8760 hours. Basic Metabolic Panel: Recent Labs  Lab 01/04/20 0344 01/04/20 1621 01/05/20 0832 01/06/20 0413  NA 139 137 139 140  K 3.7 3.4* 3.2* 3.8  CL 95* 100 101 100  CO2 _0 GLUCOSE 158* 166* 179* 150*  BUN _1 CREATININE 1.44* 1.15 1.12 1.03  CALCIUM 11.0* 9.9 10.1 10.3   Liver Function Tests: No results for input(s): AST, ALT, ALKPHOS, BILITOT, PROT, ALBUMIN in the last 168 hours. No results for input(s): LIPASE, AMYLASE in the last 168 hours. No results for input(s): AMMONIA in the last 168 hours. CBC: Recent Labs  Lab 01/04/20 0344 01/06/20 0413  WBC 12.5* 13.6*  NEUTROABS 10.1*  --   HGB 13.3 10.0*  HCT 45.2 32.8*  MCV 81.9 80.0  PLT 587* 399   Cardiac Enzymes: No results for input(s): CKTOTAL, CKMB, CKMBINDEX, TROPONINI in the last 168 hours. BNP: Invalid input(s): POCBNP CBG: Recent Labs  Lab 01/04/20 0955 01/04/20 1205 01/04/20 1551  GLUCAP 110* 114* 150*   D-Dimer No results for input(s): DDIMER in the last 72 hours. Hgb A1c No results for input(s): HGBA1C in the last 72 hours. Lipid Profile No results for input(s): CHOL, HDL, LDLCALC, TRIG, CHOLHDL, LDLDIRECT in the last 72 hours. Thyroid function studies No results for input(s): TSH, T4TOTAL, T3FREE, THYROIDAB in the last 72 hours.  Invalid input(s): FREET3 Anemia work up No results for input(s): VITAMINB12, FOLATE, FERRITIN, TIBC, IRON, RETICCTPCT in the last 72 hours. Urinalysis    Component Value Date/Time   COLORURINE Amber 12/25/2011 0031   APPEARANCEUR Cloudy 12/25/2011 0031   LABSPEC 1.019 12/25/2011 0031   PHURINE 5.0  12/25/2011 0031   GLUCOSEU Negative 12/25/2011 0031   HGBUR 2+ 12/25/2011 0031   BILIRUBINUR Negative 12/25/2011 0031   KETONESUR Negative 12/25/2011 0031   PROTEINUR Negative 12/25/2011 0031   NITRITE Negative 12/25/2011 0031   LEUKOCYTESUR 2+ 12/25/2011 0031   Sepsis Labs Invalid input(s): PROCALCITONIN,  WBC,  LACTICIDVEN Microbiology Recent Results (from the past 240 hour(s))  Resp Panel by RT-PCR (Flu A&B, Covid) Nasopharyngeal Swab     Status: None   Collection Time: 01/04/20  6:02 AM   Specimen: Nasopharyngeal Swab; Nasopharyngeal(NP) swabs in vial transport medium  Result Value Ref Range Status   SARS Coronavirus 2 by RT PCR NEGATIVE NEGATIVE Final  Comment: (NOTE) SARS-CoV-2 target nucleic acids are NOT DETECTED.  The SARS-CoV-2 RNA is generally detectable in upper respiratory specimens during the acute phase of infection. The lowest concentration of SARS-CoV-2 viral copies this assay can detect is 138 copies/mL. A negative result does not preclude SARS-Cov-2 infection and should not be used as the sole basis for treatment or other patient management decisions. A negative result may occur with  improper specimen collection/handling, submission of specimen other than nasopharyngeal swab, presence of viral mutation(s) within the areas targeted by this assay, and inadequate number of viral copies(<138 copies/mL). A negative result must be combined with clinical observations, patient history, and epidemiological information. The expected result is Negative.  Fact Sheet for Patients:  EntrepreneurPulse.com.au  Fact Sheet for Healthcare Providers:  IncredibleEmployment.be  This test is no t yet approved or cleared by the [place] FDA and  has been authorized for detection and/or diagnosis of SARS-CoV-2 by FDA under an Emergency Use Authorization (EUA). This EUA will remain  in effect (meaning this test can be used) for the  duration of the COVID-19 declaration under Section 564(b)(1) of the Act, 21 U.S.C.section 360bbb-3(b)(1), unless the authorization is terminated  or revoked sooner.       Influenza A by PCR NEGATIVE NEGATIVE Final   Influenza B by PCR NEGATIVE NEGATIVE Final    Comment: (NOTE) The Xpert Xpress SARS-CoV-2/FLU/RSV plus assay is intended as an aid in the diagnosis of influenza from Nasopharyngeal swab specimens and should not be used as a sole basis for treatment. Nasal washings and aspirates are unacceptable for Xpert Xpress SARS-CoV-2/FLU/RSV testing.  Fact Sheet for Patients: EntrepreneurPulse.com.au  Fact Sheet for Healthcare Providers: IncredibleEmployment.be  This test is not yet approved or cleared by the [place] FDA and has been authorized for detection and/or diagnosis of SARS-CoV-2 by FDA under an Emergency Use Authorization (EUA). This EUA will remain in effect (meaning this test can be used) for the duration of the COVID-19 declaration under Section 564(b)(1) of the Act, 21 U.S.C. section 360bbb-3(b)(1), unless the authorization is terminated or revoked.  Performed at [facility], [address] [place], [place] [address]   Culture, blood (x 2)     Status: None (Preliminary result)   Collection Time: 01/04/20  6:02 AM   Specimen: BLOOD  Result Value Ref Range Status   Specimen Description BLOOD LEFT [facility]  Final   Special Requests   Final    BOTTLES DRAWN AEROBIC AND ANAEROBIC Blood Culture adequate volume   Culture   Final    NO GROWTH 3 DAYS Performed at [facility], [address], [place], [place] [address]    Report Status PENDING  Incomplete  Culture, blood (x 2)     Status: None (Preliminary result)   Collection Time: 01/04/20  6:02 AM   Specimen: BLOOD  Result Value Ref Range Status   Specimen Description BLOOD RIGHT WRIST  Final   Special Requests   Final    BOTTLES DRAWN AEROBIC AND  ANAEROBIC Blood Culture results may not be optimal due to an inadequate volume of blood received in culture bottles   Culture   Final    NO GROWTH 3 DAYS Performed at [facility], [address] [place], [place] [address]    Report Status PENDING  Incomplete  MRSA PCR Screening     Status: None   Collection Time: 01/04/20  1:30 PM   Specimen: Nasal Mucosa; Nasopharyngeal  Result Value Ref Range Status  MRSA by PCR NEGATIVE NEGATIVE Final    Comment:        The GeneXpert MRSA Assay (FDA approved for NASAL specimens only), is one component of a comprehensive MRSA colonization surveillance program. It is not intended to diagnose MRSA infection nor to guide or monitor treatment for MRSA infections. Performed at [facility], [address] [place], [place] [address]      Time coordinating discharge: 35 minutes  SIGNED: Antonieta Pert, MD  [facility] 01/08/2020, 9:18 AM  If 7PM-7AM, please contact night-coverage [email or web address]

## 2020-01-08 NOTE — Care Management Important Message (Signed)
Important Message  Patient Details  Name: Troy Booker MRN: [id number] Date of Birth: Oct 28, 1940   Medicare Important Message Given:  Yes     [name] 01/08/2020, 10:02 AM

## 2020-01-08 NOTE — Progress Notes (Signed)
PT Cancellation Note  Patient Details Name: KIERON KANTNER MRN: [id number] DOB: [birth year]   Cancelled Treatment:    Reason Eval/Treat Not Completed: Other (comment). PT orders completed at this time, per chart plan is to discharge back to ALF with hospice services. PT to sign off.  Lieutenant Diego PT, DPT 8:06 AM,01/08/20

## 2020-01-08 NOTE — Progress Notes (Signed)
[facility]) Hospital Liaison RN note:  Patient resting in bed with eyes closed. DME has been delivered to [facility] and plan is for [facility] to transport back to facility at 11am this morning. Spoke with son, Maxey on the phone to provide update. I have faxed the discharge summary to [facility] Referral.  Thank you for the opportunity to participate in this patient's care.  Zandra Abts, RN [facility] Liaison [phone]

## 2020-01-08 NOTE — Progress Notes (Signed)
°  Speech Language Pathology Treatment: Dysphagia  Patient Details Name: Troy Booker MRN: [id number] DOB: Mar 24, 1940 Today's Date: 01/08/2020 Time: 0900-0930 SLP Time Calculation (min) (ACUTE ONLY): 30 min  Assessment / Plan / Recommendation Clinical Impression  Pt seen for ongoing assessment of swallowing. He presented as engaged and alert today; taking his Pills Whole in Puree w/ NSG and verbally responsive to indicated his wants/needs. He followed instructions w/ min cues. Pt is on RA; afebrile.  Pt explained general aspiration precautions and agreed verbally to the need for following them especially sitting upright for all oral intake. Pt assisted w/ positioning d/t weakness and slight decline in bed. He consumed trials of puree w/ Pills w/ NSG then was given trials of thin liquids -- No overt clinical s/s of aspiration were noted w/ any consistency; respiratory status remained calm and unlabored, vocal quality clear b/t trials. Pt held Cup when drinking following instructions for single, small sips slowly. NO straws were utiilized for better oral control. The puree providing cohesion for swallowing tablets which is beneficial for pt at this time. Oral phase appeared grossly [facility] for bolus management and timely A-P transfer for swallowing; oral clearing achieved w/ all consistencies.  However, when pt was offered trials of Minced, broken soft solids, he decline - he refused despite encouragement to attempt trials in order to upgrade his diet consistency. NSG present.  Recommend continue a Dysphagia level 1 diet (puree) w/ gravies added to moisten foods; Thin liquids Via Cup. Recommend ST services f/u at SNF for bedside therapeutic trials in hopes to liberate food consistency of diet. Recommend general aspiration precautions; Pills Whole in Puree; tray setup and positioning assistance for meals. ST services will be available for any further needs while admitted; pt is d/t d/c to SNF today. MD/NSG  updated. Precautions posted at bedside.     HPI HPI: Per admitting H&P "  Troy Booker is a 79 y.o. male with medical history significant for depression, dementia, HTN, CKD 3 a, BPH, who was brought in by EMS who reported that patient ' rolled out of bed' when the nursing home staff went to check on him.  Patient had been coughing and he now complains of weakness and shortness of breath he denied chest pain.  Has had no fever.  EMS recorded O2 sat of 91% on room air on their arrival, lowering to 87 and route to the hospital"      SLP Plan  Continue with current plan of care (d/cing to SNF today)       Recommendations  Diet recommendations: Dysphagia 1 (puree);Thin liquid Liquids provided via: Cup;No straw Medication Administration: Whole meds with puree Supervision: Patient able to self feed;Intermittent supervision to cue for compensatory strategies (setup) Compensations: Minimize environmental distractions;Slow rate;Small sips/bites;Lingual sweep for clearance of pocketing;Follow solids with liquid Postural Changes and/or Swallow Maneuvers: Seated upright 90 degrees;Upright 30-60 min after meal                General recommendations:  (Dietician f/u; Palliative f/u) Oral Care Recommendations: Oral care BID;Staff/trained caregiver to provide oral care Follow up Recommendations: Skilled Nursing facility SLP Visit Diagnosis: Dysphagia, unspecified (R13.10) Plan: Continue with current plan of care (d/cing to SNF today)       GO                 Orinda Kenner, MS, CCC-SLP Speech Language Pathologist Rehab Services [phone] [facility] 01/08/2020, 1:55 PM

## 2020-01-08 NOTE — TOC Transition Note (Signed)
Transition of Care [facility]) - CM/SW Discharge Note   Patient Details  Name: DOMINIE BENEDICK MRN: [id number] Date of Birth: July 11, 1940  [facility] of [facility]) CM/SW Contact:  Magnus Ivan, LCSW Phone Number: 01/08/2020, 9:47 AM   Clinical Narrative:   Patient to discharge to [facility] with [facility] today. CSW confirmed with Rachel Bo at [employer] that DME has been delivered and they can pick patient up since RN reported she feels patient can pivot and get onto their van. [facility] to pick patient up at 11 am. CSW updated patient's son via phone, RN, MD, NP, and Emergency planning/management officer. No additional needs prior to DC.   Final next level of care: Home w Hospice Care Barriers to Discharge: Barriers Resolved   Patient Goals and CMS Choice Patient states their goals for this hospitalization and ongoing recovery are:: to return to ALF with hospice (per son) CMS Medicare.gov Compare Post Acute Care list provided to:: Patient Represenative (must comment) Choice offered to / list presented to : Adult Children  Discharge Placement                Patient to be transferred to facility by: [facility] Name of family member notified: Adal Sereno, son Patient and family notified of of transfer: 01/08/20  Discharge Plan and Services                                     Social Determinants of Health (SDOH) Interventions     Readmission Risk Interventions No flowsheet data found.

## 2020-01-08 NOTE — Progress Notes (Signed)
Daily Progress Note   Patient Name: EMMANUAL GAUTHREAUX       Date: 01/08/2020 DOB: [birth year]  Age: 79 y.o. MRN#: [id number] Attending Physician: Antonieta Pert, MD Primary Care Physician: Patient, No Pcp Per Admit Date: 01/04/2020  Reason for Consultation/Follow-up: Establishing goals of care, Hospice Evaluation and Psychosocial/spiritual support  Subjective: Patient sleeping, briefly wake to voice and physical stimulation - tells me he feels bad, but no pain, no shortness of breath  Length of Stay: 4  Current Medications: Scheduled Meds:  . allopurinol  100 mg Oral Daily  . DULoxetine  60 mg Oral Daily  . enoxaparin (LOVENOX) injection  40 mg Subcutaneous Q24H  . feeding supplement  1 Container Oral TID BM  . mirtazapine  7.5 mg Oral QHS  . predniSONE  20 mg Oral Q breakfast  . tamsulosin  0.4 mg Oral QHS    Continuous Infusions: . sodium chloride Stopped (01/06/20 0650)    PRN Meds: sodium chloride, acetaminophen, alum & mag hydroxide-simeth, ipratropium-albuterol, simethicone  Physical Exam Constitutional:      General: He is not in acute distress.    Comments: lethargic  Pulmonary:     Effort: Pulmonary effort is normal.  Neurological:     Mental Status: He is disoriented.  Psychiatric:     Comments: withdrawn             Vital Signs: BP 140/73 (BP Location: Left Arm)   Pulse 94   Temp 98.7 F (37.1 C) (Oral)   Resp 18   Ht 5\' 6"  (1.676 m)   Wt 82.1 kg Comment: bed scale  SpO2 (!) 87%   BMI 29.21 kg/m  SpO2: SpO2: (!) 87 % O2 Device: O2 Device: Nasal Cannula O2 Flow Rate: O2 Flow Rate (L/min): 2 L/min  Intake/output summary:   Intake/Output Summary (Last 24 hours) at 01/08/2020 0931 Last data filed at 01/08/2020 0435 Gross per 24 hour  Intake 240 ml  Output 650  ml  Net -410 ml   LBM: Last BM Date: 01/06/20 Baseline Weight: Weight: 96.6 kg Most recent weight: Weight: 82.1 kg (bed scale)  Palliative Assessment/Data: PPS 40%    Flowsheet Rows   Flowsheet Row Most Recent Value  Intake Tab   Referral Department Hospitalist  Unit at Time of Referral Med/Surg Unit  Palliative Care Primary Diagnosis Sepsis/Infectious Disease  Date Notified 01/05/20  Palliative Care Type New Palliative care  Reason for referral Clarify Goals of Care  Date of Admission 01/04/20  Date first seen by Palliative Care 01/06/20  # of days Palliative referral response time 1 Day(s)  # of days IP prior to Palliative referral 1  Clinical Assessment   Palliative Performance Scale Score 40%  Psychosocial & Spiritual Assessment   Palliative Care Outcomes   Patient/Family meeting held? Yes  Who was at the meeting? son  Palliative Care Outcomes Clarified goals of care, Counseled regarding hospice, Provided end of life care assistance, Provided advance care planning, Provided psychosocial or spiritual support, Changed to focus on comfort, Completed durable DNR, Transitioned to hospice      Patient Active Problem List   Diagnosis Date Noted  . Malnutrition of moderate degree 01/06/2020  . Acute kidney  injury superimposed on CKD ([place])   . Essential hypertension 01/04/2020  . Hypothyroidism 01/04/2020  . Stage 3a chronic kidney disease ([place]) 01/04/2020  . RLL pneumonia 01/04/2020  . Hypoxia 01/04/2020  . Type 2 diabetes mellitus without complications ([place]) 82/50/0370  . Sepsis with acute hypoxic respiratory failure without septic shock ([place])   . Lobar pneumonia ([place])   . Acute respiratory failure with hypoxia ([place])   . Dehydration   . Hypercalcemia   . Benign prostatic hyperplasia with urinary frequency 05/11/2016  . Dementia with behavioral disturbance ([place]) 05/11/2016  . Major depression in remission ([place]) 05/11/2016    Palliative Care Assessment & Plan    HPI: 79 y.o. male  with past medical history of depression, dementia, HTN, and CKD admitted on 01/04/2020 with RLL pna. CT chest concerning for lower lobe mass/malignancy with osseous, hepatic, adrenal, and pancreatic metastasis. Also with hypercalcemia. PMT consulted to discuss [place].  Assessment: F/u with patient. No family at bedside. He is sleeping but wakes easily.  Tells me he does not feel good but cannot specify. Denies pain, denies shortness of breath.  Tells me he does not know what is going on and does not want to know what's going on. We discuss him leaving hospital today and he nods and goes back to sleep.   Recommendations/Plan:  D/c to ALF today with hospice to follow  Comfort approach to care moving forward  DNR - verified form on chart  Goals of Care and Additional Recommendations:  Limitations on Scope of Treatment: Full Comfort Care  Code Status:  DNR  Prognosis:   < 6 months  Discharge Planning:  to ALF with hospice  Care plan was discussed with patient, CSW  Thank you for allowing the Palliative Medicine Team to assist in the care of this patient.   Total Time 25 minutes Prolonged Time Billed  no       Greater than 50%  of this time was spent counseling and coordinating care related to the above assessment and plan.  Juel Burrow, DNP, [facility] Palliative Medicine Team Team Phone # [phone]  Pager [phone]

## 2020-01-09 LAB — CULTURE, BLOOD (ROUTINE X 2)
Culture: NO GROWTH
Culture: NO GROWTH
Special Requests: ADEQUATE

## 2020-01-10 LAB — PTH-RELATED PEPTIDE: PTH-related peptide: <2 pmol/L

## 2020-02-23 DEATH — deceased

## 2022-09-22 IMAGING — CT CT ANGIO CHEST
2 of 6 series · 17 of 46 positions shown · IV contrast (APPLIED)
Comparison: None.

CLINICAL DATA: 79-year-old male with concern for pulmonary
embolism.

EXAM:
CT ANGIOGRAPHY CHEST WITH CONTRAST
TECHNIQUE: Multidetector CT imaging of the chest was performed using the
standard protocol during bolus administration of intravenous
contrast. Multiplanar CT image reconstructions and MIPs were
obtained to evaluate the vascular anatomy.
CONTRAST:  75mL OMNIPAQUE IOHEXOL 350 MG/ML SOLN

[Series 5: thins · axial · 0.72mm/px · z∈[-744,-470]mm · 14 of 302 slices shown]
[im 14/302  lung]
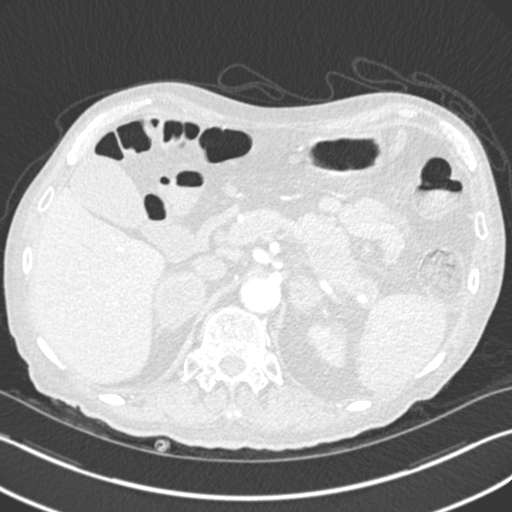
[im 40/302  soft-tissue]
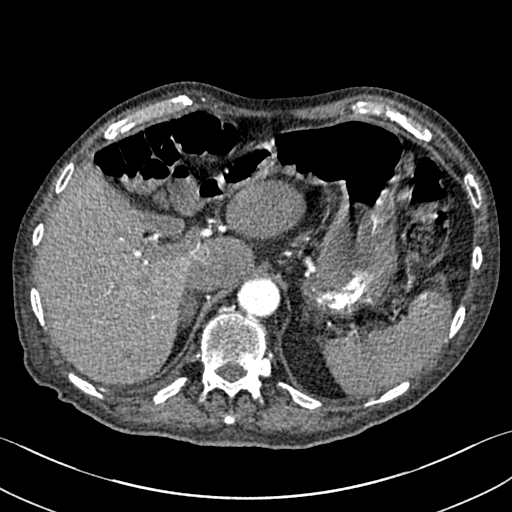
[im 53/302  lung]
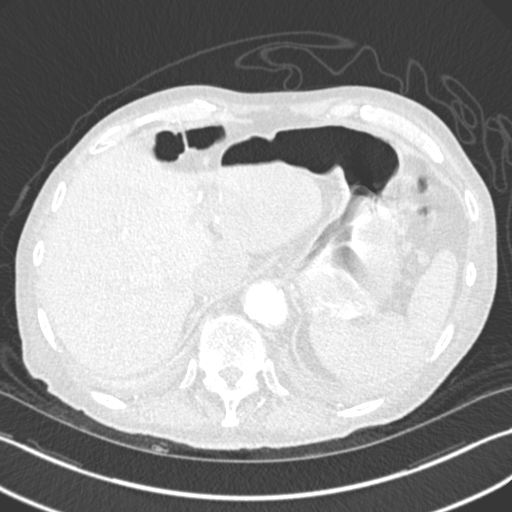
[im 79/302  soft-tissue]
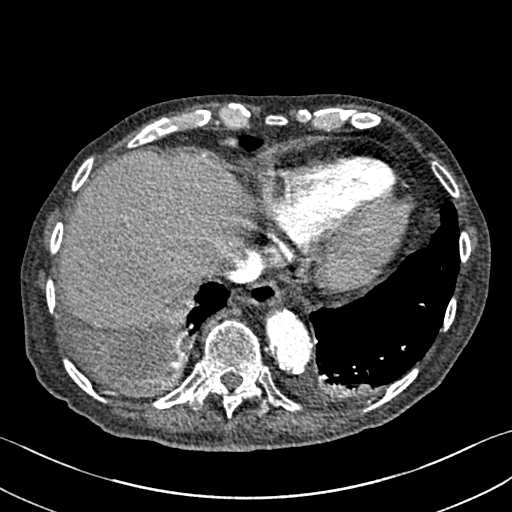
[im 105/302  lung]
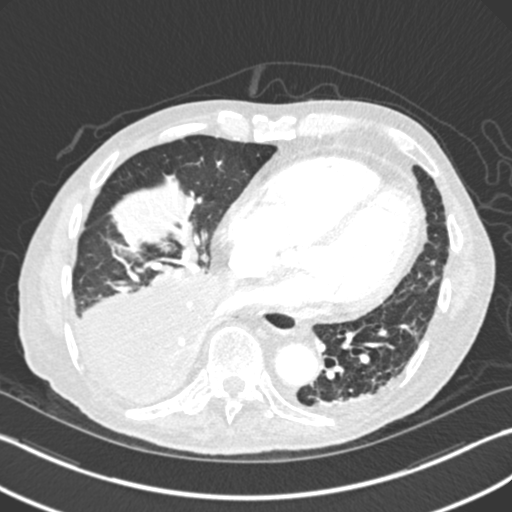
[im 118/302  soft-tissue]
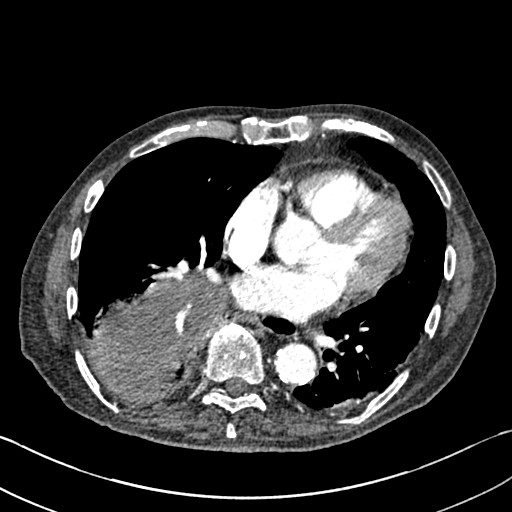
[im 144/302  lung]
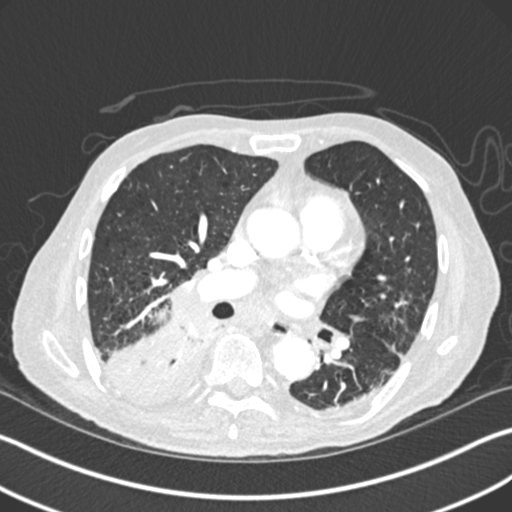
[im 158/302  soft-tissue]
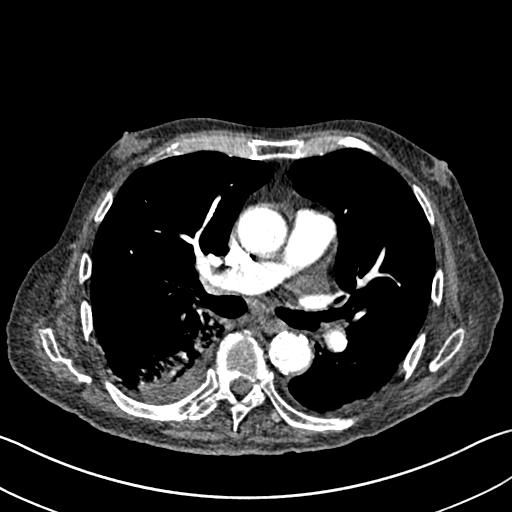
[im 184/302  lung]
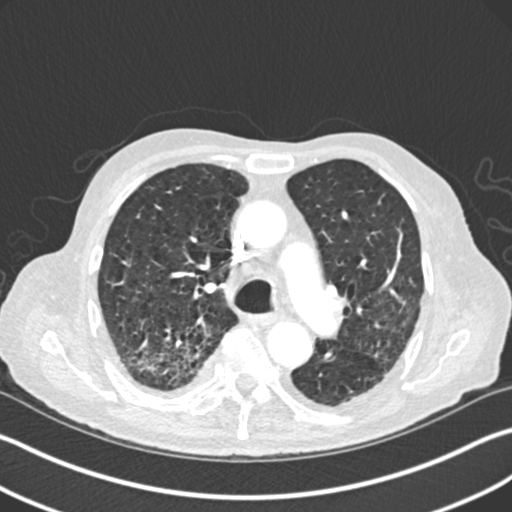
[im 197/302  soft-tissue]
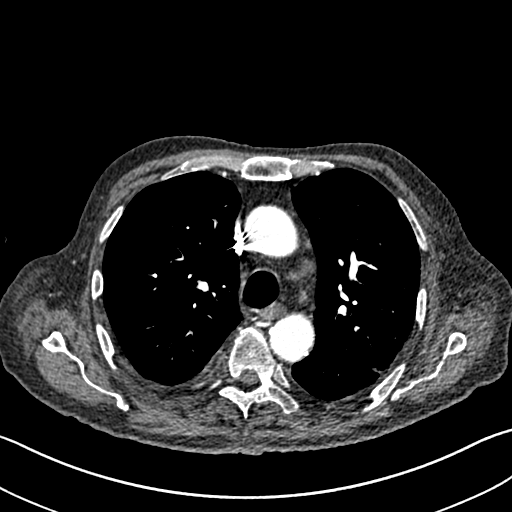
[im 223/302  lung]
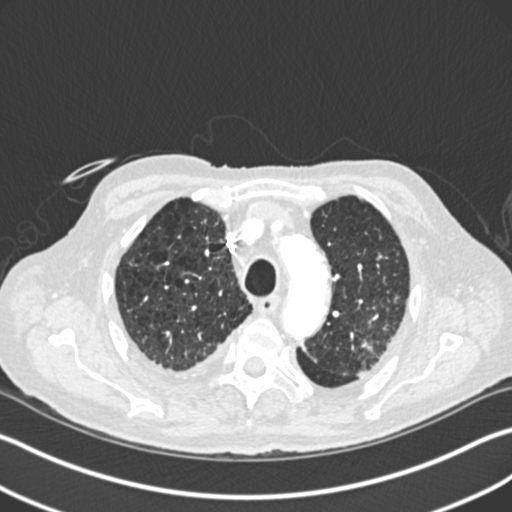
[im 249/302  soft-tissue]
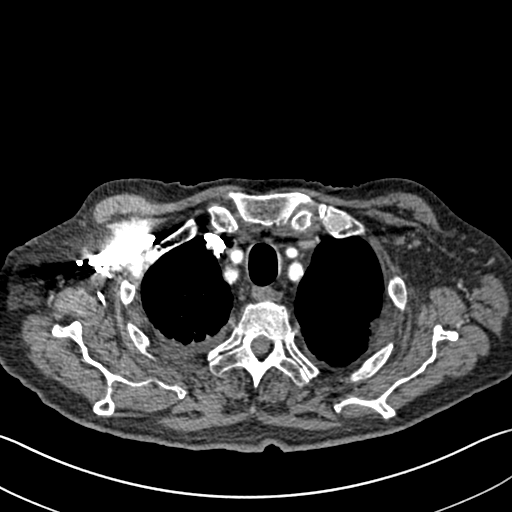
[im 262/302  lung]
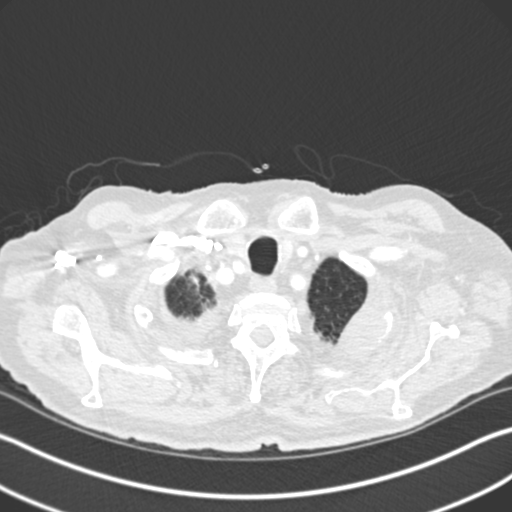
[im 288/302  soft-tissue]
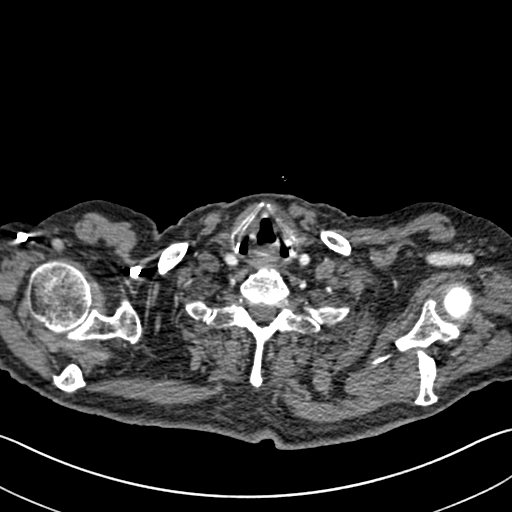

[Series 7: coronal mpr · coronal · 0.60mm/px · 3 of 83 slices shown]
[im 21/83  soft-tissue]
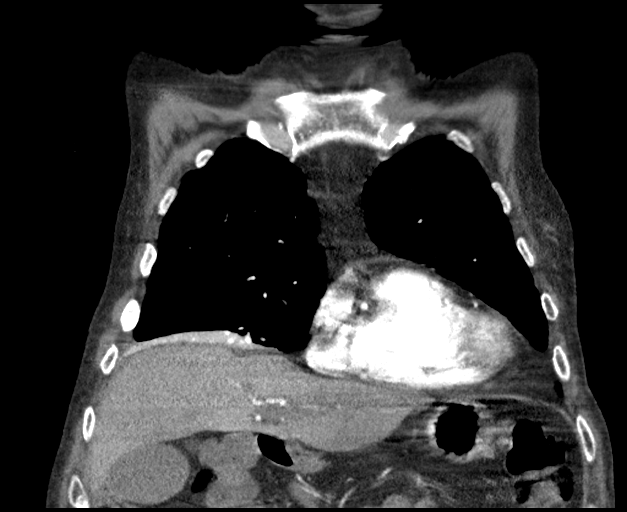
[im 42/83  soft-tissue]
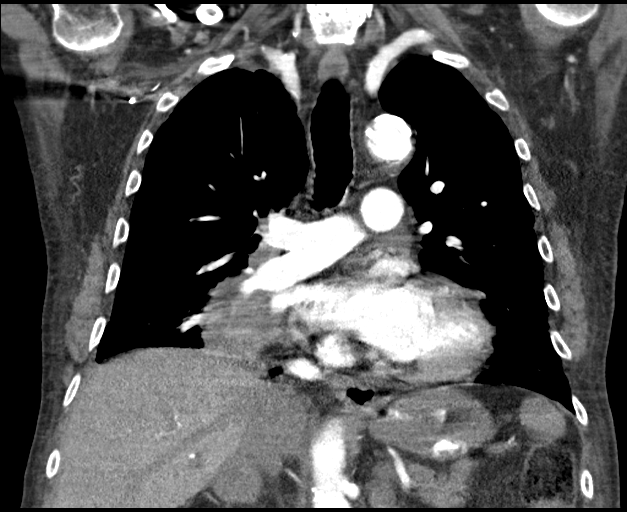
[im 62/83  soft-tissue]
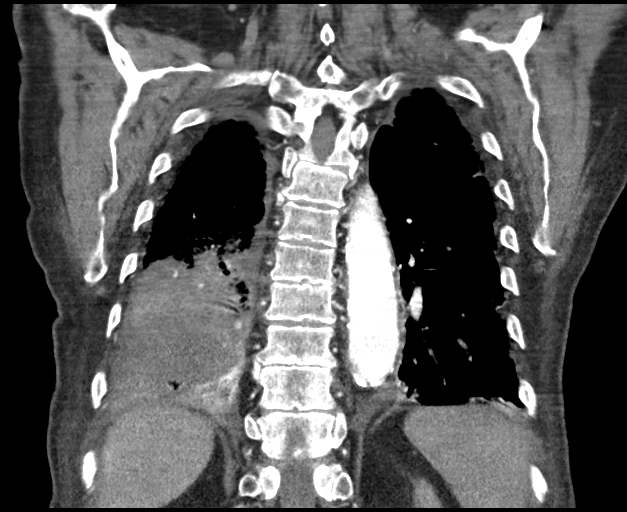

[17 of 46 positions shown; findings below may reference images not displayed]

FINDINGS: Cardiovascular: Top-normal cardiac size. No pericardial effusion.
There is coronary vascular calcification. There is moderate
atherosclerotic calcification of the thoracic aorta. No aneurysmal
dilatation or dissection. The origins of the great vessels of the
aortic arch appear patent as visualized. No pulmonary artery embolus
identified.

Mediastinum/Nodes: Subcarinal adenopathy measures 17 mm in short
axis. Right hilar adenopathy measures 16 mm in short axis. The
esophagus is grossly unremarkable. No mediastinal fluid collection.

Lungs/Pleura: There is a large area of masslike consolidation in the
right lower lobe extending to the right hilum. The hypoattenuating
masslike density in the right hilar/infrahilar region extending to
the posterior and lateral basal segments and pleural surface
measures approximately 11 x 8 cm in greatest axial dimensions
(202/5) and 7 cm in craniocaudal length.

There is associated mass effect and high-grade narrowing of the
bronchus intermedius and complete occlusion of the right middle and
right lower lobe bronchi. There is additionally mass effect and
splaying of the right lower lobe pulmonary artery branches.

There is a background of moderate centrilobular emphysema. Left lung
base subsegmental atelectasis. There are small bilateral pleural
effusions. There is slight thickening and nodularity of the
posterior right pleural surface concerning for metastatic implants.
No pneumothorax.

Upper Abdomen: Bilateral adrenal masses measuring up to 4.5 x 3.0 cm
on the right consistent with metastatic disease. There is a large
area of hypoattenuation involving the left lobe of the liver most
likely metastatic disease. Hypoperfusion/infarcts secondary to
portal vein thrombosis is less likely but not excluded. Evaluation
of the patency of the main portal vein is limited on this CT in the
absence of delayed/portal phase imaging. Duplex ultrasound may
provide better evaluation and assess patency of the main portal
vein. Hypoattenuating lesion in the caudate lobe also consistent
with metastatic disease.

There is ill-defined hypoattenuating lesion in the distal body/tail
of the pancreas most consistent with metastasis.

Musculoskeletal: Old right rib fractures. No acute fracture. There
is a 3.5 x 2.5 cm metastatic lytic/destructive lesion involving the
left second rib.

Review of the MIP images confirms the above findings.
IMPRESSION: 1. No CT evidence of pulmonary artery embolus.
2. Large right infrahilar/right lower lobe mass/malignancy with mass
effect and occlusion of the right middle and right lower lobe
bronchi.
3. Nodal, osseous, hepatic, adrenal, and pancreatic metastatic
disease.
4. Aortic Atherosclerosis (L3PZN-HY8.8) and Emphysema (L3PZN-ONH.6).
